# Patient Record
Sex: Male | Born: 1950
Health system: Southern US, Community
[De-identification: ages and names within clinical notes are randomized; demographics above are authoritative.]

## PROBLEM LIST (undated history)

## (undated) DIAGNOSIS — C801 Malignant (primary) neoplasm, unspecified: Secondary | ICD-10-CM

## (undated) DIAGNOSIS — M1990 Unspecified osteoarthritis, unspecified site: Secondary | ICD-10-CM

## (undated) DIAGNOSIS — E785 Hyperlipidemia, unspecified: Secondary | ICD-10-CM

## (undated) DIAGNOSIS — Z87442 Personal history of urinary calculi: Secondary | ICD-10-CM

## (undated) DIAGNOSIS — I10 Essential (primary) hypertension: Secondary | ICD-10-CM

## (undated) DIAGNOSIS — T7840XA Allergy, unspecified, initial encounter: Secondary | ICD-10-CM

## (undated) DIAGNOSIS — R51 Headache: Secondary | ICD-10-CM

## (undated) DIAGNOSIS — N200 Calculus of kidney: Secondary | ICD-10-CM

## (undated) HISTORY — DX: Hyperlipidemia, unspecified: E78.5

## (undated) HISTORY — DX: Malignant (primary) neoplasm, unspecified: C80.1

## (undated) HISTORY — DX: Essential (primary) hypertension: I10

## (undated) HISTORY — DX: Allergy, unspecified, initial encounter: T78.40XA

## (undated) HISTORY — PX: OTHER SURGICAL HISTORY: SHX169

## (undated) HISTORY — PX: KIDNEY STONE SURGERY: SHX686

## (undated) HISTORY — DX: Calculus of kidney: N20.0

## (undated) HISTORY — DX: Headache: R51

## (undated) HISTORY — PX: TONSILLECTOMY: SUR1361

## (undated) HISTORY — PX: COLONOSCOPY: SHX174

---

## 1998-09-08 HISTORY — PX: OTHER SURGICAL HISTORY: SHX169

## 1999-05-22 ENCOUNTER — Emergency Department (HOSPITAL_COMMUNITY): Admission: EM | Admit: 1999-05-22 | Discharge: 1999-05-22 | Payer: Self-pay | Admitting: Emergency Medicine

## 1999-05-22 ENCOUNTER — Encounter: Payer: Self-pay | Admitting: Emergency Medicine

## 2011-09-09 HISTORY — PX: POLYPECTOMY: SHX149

## 2016-07-23 DIAGNOSIS — L821 Other seborrheic keratosis: Secondary | ICD-10-CM | POA: Diagnosis not present

## 2016-07-23 DIAGNOSIS — Z85828 Personal history of other malignant neoplasm of skin: Secondary | ICD-10-CM | POA: Diagnosis not present

## 2016-07-23 DIAGNOSIS — L570 Actinic keratosis: Secondary | ICD-10-CM | POA: Diagnosis not present

## 2016-07-23 DIAGNOSIS — D1801 Hemangioma of skin and subcutaneous tissue: Secondary | ICD-10-CM | POA: Diagnosis not present

## 2016-07-23 DIAGNOSIS — D485 Neoplasm of uncertain behavior of skin: Secondary | ICD-10-CM | POA: Diagnosis not present

## 2016-07-23 DIAGNOSIS — C44309 Unspecified malignant neoplasm of skin of other parts of face: Secondary | ICD-10-CM | POA: Diagnosis not present

## 2016-09-10 DIAGNOSIS — C44329 Squamous cell carcinoma of skin of other parts of face: Secondary | ICD-10-CM | POA: Diagnosis not present

## 2016-09-10 DIAGNOSIS — L900 Lichen sclerosus et atrophicus: Secondary | ICD-10-CM | POA: Diagnosis not present

## 2017-06-04 DIAGNOSIS — D485 Neoplasm of uncertain behavior of skin: Secondary | ICD-10-CM | POA: Diagnosis not present

## 2017-06-04 DIAGNOSIS — L570 Actinic keratosis: Secondary | ICD-10-CM | POA: Diagnosis not present

## 2017-06-04 DIAGNOSIS — C4442 Squamous cell carcinoma of skin of scalp and neck: Secondary | ICD-10-CM | POA: Diagnosis not present

## 2017-07-15 DIAGNOSIS — C4442 Squamous cell carcinoma of skin of scalp and neck: Secondary | ICD-10-CM | POA: Diagnosis not present

## 2017-09-21 ENCOUNTER — Encounter: Payer: Self-pay | Admitting: Family Medicine

## 2017-09-21 ENCOUNTER — Ambulatory Visit (INDEPENDENT_AMBULATORY_CARE_PROVIDER_SITE_OTHER): Payer: PPO | Admitting: Family Medicine

## 2017-09-21 VITALS — BP 136/72 | HR 77 | Ht 71.5 in | Wt 184.0 lb

## 2017-09-21 DIAGNOSIS — Z23 Encounter for immunization: Secondary | ICD-10-CM | POA: Insufficient documentation

## 2017-09-21 DIAGNOSIS — Z0001 Encounter for general adult medical examination with abnormal findings: Secondary | ICD-10-CM | POA: Diagnosis not present

## 2017-09-21 DIAGNOSIS — G4489 Other headache syndrome: Secondary | ICD-10-CM

## 2017-09-21 DIAGNOSIS — G4459 Other complicated headache syndrome: Secondary | ICD-10-CM

## 2017-09-21 DIAGNOSIS — Z1211 Encounter for screening for malignant neoplasm of colon: Secondary | ICD-10-CM | POA: Insufficient documentation

## 2017-09-21 DIAGNOSIS — Z131 Encounter for screening for diabetes mellitus: Secondary | ICD-10-CM | POA: Insufficient documentation

## 2017-09-21 DIAGNOSIS — Z0000 Encounter for general adult medical examination without abnormal findings: Secondary | ICD-10-CM | POA: Insufficient documentation

## 2017-09-21 NOTE — Progress Notes (Addendum)
Subjective:  Patient ID: Kerry Bright, male    DOB: 1951/07/26  Age: 67 y.o. MRN: [id number]  CC: No chief complaint on file.   HPI Kerry Bright presents for establishment of care, physical exam, and to be evaluated for a headache that he has been experiencing over the last month or 2.  The headache is multifocal it can last for varying lengths of time.  It seems to respond to Tylenol or an Aleve but then returns.  He has no history of headaches prior to these.. Left hand dominant. The headache is primarily in his frontal area.  He feels no pressure stuffy nose drainage or rhinorrhea.  He has had no weight loss or night sweats.  He has had no changes in vision difficulty swallowing or unilateral weakness or paresthesias.  He feels healthy.  He drinks anywhere from 2-3 beers daily.  He denies binge drinking.  He does not use drugs.  He quit smoking 39 years ago.  Of note he was a bachelor over the past 49 years but recently married his significant other 4 months ago.  They just bought a house together.  He bought a car.  His daughter and father both died of brain tumors.  He is not active physically as he was before his marriage but he continues to walk for exercise.  Chart review shows that he had a colonoscopy in 2013 with one hyperplastic polyp.  History Kerry Bright has a past medical history of Frequent headaches and Kidney stones.   He has a past surgical history that includes Tonsillectomy.   His family history includes Cancer in his daughter and father.He reports that he has quit smoking. he has never used smokeless tobacco. His alcohol and drug histories are not on file.  No outpatient medications prior to visit.   No facility-administered medications prior to visit.     ROS Review of Systems  Constitutional: Negative for chills, fatigue, fever and unexpected weight change.  HENT: Negative for congestion, hearing loss, postnasal drip, rhinorrhea, sinus pressure, sore throat and trouble  swallowing.   Eyes: Negative for photophobia and visual disturbance.  Respiratory: Negative for cough and shortness of breath.   Cardiovascular: Negative for chest pain and palpitations.  Gastrointestinal: Negative.   Endocrine: Negative for polyphagia and polyuria.  Genitourinary: Negative for decreased urine volume, difficulty urinating and urgency.  Musculoskeletal: Negative for arthralgias and myalgias.  Skin: Negative for color change and rash.  Allergic/Immunologic: Negative for immunocompromised state.  Neurological: Positive for headaches. Negative for facial asymmetry, speech difficulty, weakness, light-headedness and numbness.  Hematological: Does not bruise/bleed easily.  Psychiatric/Behavioral: Negative for dysphoric mood, self-injury and suicidal ideas. The patient is not nervous/anxious.     Objective:  BP 136/72 (BP Location: Left Arm, Patient Position: Sitting, Cuff Size: Normal)   Pulse 77   Ht 5' 11.5" (1.816 m)   Wt 184 lb (83.5 kg)   SpO2 97%   BMI 25.31 kg/m   Physical Exam  Constitutional: He is oriented to person, place, and time. He appears well-developed and well-nourished. No distress.  HENT:  Head: Normocephalic and atraumatic.  Right Ear: External ear normal.  Left Ear: External ear normal.  Mouth/Throat: Oropharynx is clear and moist. No oropharyngeal exudate.  Eyes: Conjunctivae and EOM are normal. Pupils are equal, round, and reactive to light. Right eye exhibits no discharge. Left eye exhibits no discharge. No scleral icterus.  Fundoscopic exam:      The right eye shows no AV nicking, no  exudate, no hemorrhage and no papilledema.       The left eye shows no AV nicking, no exudate, no hemorrhage and no papilledema.  Neck: Neck supple. No JVD present. No tracheal deviation present. No thyromegaly present.  Cardiovascular: Normal rate, regular rhythm and normal heart sounds.  Pulmonary/Chest: Effort normal and breath sounds normal. No stridor. No  respiratory distress. He has no wheezes. He has no rales.  Abdominal: Bowel sounds are normal. He exhibits no distension. There is no tenderness. There is no rebound and no guarding.  Lymphadenopathy:    He has no cervical adenopathy.  Neurological: He is alert and oriented to person, place, and time. He has normal strength. No cranial nerve deficit.  Head is cocked to left.   Skin: Skin is warm and dry. He is not diaphoretic.  Psychiatric: He has a normal mood and affect. His behavior is normal.      Assessment & Plan:   Diagnoses and all orders for this visit:  Need for influenza vaccination -     Flu vaccine HIGH DOSE PF  Need for 23-polyvalent pneumococcal polysaccharide vaccine -     Pneumococcal polysaccharide vaccine 23-valent greater than or equal to 2yo subcutaneous/IM  Healthcare maintenance -     CBC; Future -     Comprehensive metabolic panel; Future -     Hepatitis C antibody; Future -     Lipid panel; Future -     PSA; Future -     TSH; Future -     Urinalysis, Routine w reflex microscopic; Future -     VITAMIN D 25 Hydroxy (Vit-D Deficiency, Fractures); Future  Other headache syndrome -     CBC; Future  Other complicated headache syndrome -     Cancel: CT Head W Contrast; Future -     Cancel: CT Head Wo Contrast; Future -     CT HEAD W & WO CONTRAST; Future   Kerry Bright does not currently have medications on file.  No orders of the defined types were placed in this encounter.  He is alert and maintain a healthy lifestyle and increase his exercise to 30 minutes 5 days/week.  Follow-up: after his scan  Libby Maw, MD

## 2017-09-22 ENCOUNTER — Other Ambulatory Visit (INDEPENDENT_AMBULATORY_CARE_PROVIDER_SITE_OTHER): Payer: PPO

## 2017-09-22 ENCOUNTER — Encounter: Payer: Self-pay | Admitting: Family Medicine

## 2017-09-22 DIAGNOSIS — Z23 Encounter for immunization: Secondary | ICD-10-CM

## 2017-09-22 DIAGNOSIS — Z0000 Encounter for general adult medical examination without abnormal findings: Secondary | ICD-10-CM | POA: Diagnosis not present

## 2017-09-22 DIAGNOSIS — G4489 Other headache syndrome: Secondary | ICD-10-CM

## 2017-09-22 LAB — CBC
HCT: 45.4 % (ref 39.0–52.0)
HEMOGLOBIN: 15.3 g/dL (ref 13.0–17.0)
MCHC: 33.6 g/dL (ref 30.0–36.0)
MCV: 89.3 fl (ref 78.0–100.0)
Platelets: 211 10*3/uL (ref 150.0–400.0)
RBC: 5.09 Mil/uL (ref 4.22–5.81)
RDW: 13.8 % (ref 11.5–15.5)
WBC: 7.5 10*3/uL (ref 4.0–10.5)

## 2017-09-22 LAB — URINALYSIS, ROUTINE W REFLEX MICROSCOPIC
BILIRUBIN URINE: NEGATIVE
Hgb urine dipstick: NEGATIVE
Ketones, ur: NEGATIVE
Leukocytes, UA: NEGATIVE
Nitrite: NEGATIVE
PH: 7.5 (ref 5.0–8.0)
RBC / HPF: NONE SEEN (ref 0–?)
SPECIFIC GRAVITY, URINE: 1.015 (ref 1.000–1.030)
TOTAL PROTEIN, URINE-UPE24: NEGATIVE
Urine Glucose: NEGATIVE
Urobilinogen, UA: 0.2 (ref 0.0–1.0)
WBC UA: NONE SEEN (ref 0–?)

## 2017-09-22 LAB — COMPREHENSIVE METABOLIC PANEL
ALBUMIN: 4.4 g/dL (ref 3.5–5.2)
ALK PHOS: 40 U/L (ref 39–117)
ALT: 21 U/L (ref 0–53)
AST: 20 U/L (ref 0–37)
BUN: 21 mg/dL (ref 6–23)
CALCIUM: 9.1 mg/dL (ref 8.4–10.5)
CO2: 28 mEq/L (ref 19–32)
Chloride: 103 mEq/L (ref 96–112)
Creatinine, Ser: 0.93 mg/dL (ref 0.40–1.50)
GFR: 86.1 mL/min (ref >60.00)
Glucose, Bld: 101 mg/dL — ABNORMAL HIGH (ref 70–99)
POTASSIUM: 4.5 meq/L (ref 3.5–5.1)
SODIUM: 139 meq/L (ref 135–145)
Total Bilirubin: 1.2 mg/dL (ref 0.2–1.2)
Total Protein: 6.5 g/dL (ref 6.0–8.3)

## 2017-09-22 LAB — LIPID PANEL
CHOLESTEROL: 202 mg/dL — AB (ref 0–200)
HDL: 76.7 mg/dL (ref >39.00)
LDL CALC: 114 mg/dL — AB (ref 0–99)
NonHDL: 125.22
TRIGLYCERIDES: 58 mg/dL (ref 0.0–149.0)
Total CHOL/HDL Ratio: 3
VLDL: 11.6 mg/dL (ref 0.0–40.0)

## 2017-09-22 LAB — VITAMIN D 25 HYDROXY (VIT D DEFICIENCY, FRACTURES): VITD: 31.98 ng/mL (ref 30.00–100.00)

## 2017-09-22 LAB — TSH: TSH: 2.1 u[IU]/mL (ref 0.35–4.50)

## 2017-09-22 LAB — PSA: PSA: 0.91 ng/mL (ref 0.10–4.00)

## 2017-09-23 LAB — HEPATITIS C ANTIBODY
Hepatitis C Ab: NONREACTIVE
SIGNAL TO CUT-OFF: 0.01 (ref <1.00)

## 2017-09-24 ENCOUNTER — Ambulatory Visit (INDEPENDENT_AMBULATORY_CARE_PROVIDER_SITE_OTHER)
Admission: RE | Admit: 2017-09-24 | Discharge: 2017-09-24 | Disposition: A | Discharge status: Home / Self Care | Payer: PPO | Source: Ambulatory Visit | Attending: Family Medicine | Admitting: Family Medicine

## 2017-09-24 ENCOUNTER — Other Ambulatory Visit: Payer: Self-pay

## 2017-09-24 DIAGNOSIS — R7989 Other specified abnormal findings of blood chemistry: Secondary | ICD-10-CM

## 2017-09-24 DIAGNOSIS — R51 Headache: Secondary | ICD-10-CM | POA: Diagnosis not present

## 2017-09-24 DIAGNOSIS — G4459 Other complicated headache syndrome: Secondary | ICD-10-CM | POA: Diagnosis not present

## 2017-09-24 MED ORDER — IOPAMIDOL (ISOVUE-300) INJECTION 61%
80.0000 mL | Freq: Once | INTRAVENOUS | Status: AC | PRN
  Administered 2017-09-24: 80 mL via INTRAVENOUS

## 2017-09-24 NOTE — Addendum Note (Signed)
Addended by: Jon Billings on: 09/24/2017 09:27 AM   Modules accepted: Level of Service

## 2018-10-05 ENCOUNTER — Encounter: Payer: Self-pay | Admitting: Family Medicine

## 2018-10-05 ENCOUNTER — Ambulatory Visit (INDEPENDENT_AMBULATORY_CARE_PROVIDER_SITE_OTHER): Payer: PPO | Admitting: Family Medicine

## 2018-10-05 VITALS — BP 140/70 | HR 66 | Temp 97.8°F | Ht 71.5 in | Wt 176.6 lb

## 2018-10-05 DIAGNOSIS — Z23 Encounter for immunization: Secondary | ICD-10-CM | POA: Diagnosis not present

## 2018-10-05 DIAGNOSIS — M25552 Pain in left hip: Secondary | ICD-10-CM

## 2018-10-05 DIAGNOSIS — M7062 Trochanteric bursitis, left hip: Secondary | ICD-10-CM | POA: Insufficient documentation

## 2018-10-05 DIAGNOSIS — M25551 Pain in right hip: Secondary | ICD-10-CM | POA: Insufficient documentation

## 2018-10-05 MED ORDER — PREDNISONE 5 MG PO TABS: ORAL_TABLET | ORAL | 0 refills | Status: DC

## 2018-10-05 NOTE — Assessment & Plan Note (Signed)
Pain is possibly related to greater trochanteric bursitis versus sciatica.  Does not appear to be associated with the joint itself. -Try prednisone. -Counseled on home exercise therapy and supportive care. -If no improvement will consider injection and imaging.  Could consider physical therapy

## 2018-10-05 NOTE — Progress Notes (Signed)
Kerry Bright - 68 y.o. male MRN [id number]  Date of birth: 1951-01-25  SUBJECTIVE:  Including CC & ROS.  Chief Complaint  Patient presents with  . Pain    left hip and leg pain to knee/ limp when walking, no injury/ going on for one month    Kerry Bright is a 68 y.o. male that is presenting with lateral hip pain.  Also has some pain radiating down the lateral aspect of the leg.  Has been ongoing for about a month.  Denies any inciting event.  He is active with work and does several different lifting and maneuvering objects.  Has not had any history of similar pain.  It is intermittent in nature and worse with walking.  Pain is moderate.  It is sharp and stabbing.  Denies any numbness or tingling.    Review of Systems  Constitutional: Negative for fever.  HENT: Negative for congestion.   Respiratory: Negative for cough.   Cardiovascular: Negative for chest pain.  Gastrointestinal: Negative for abdominal pain.  Musculoskeletal: Positive for gait problem.  Skin: Negative for color change.  Neurological: Negative for weakness.  Hematological: Negative for adenopathy.  Psychiatric/Behavioral: Negative for agitation.    HISTORY: Past Medical, Surgical, Social, and Family History Reviewed & Updated per EMR.   Pertinent Historical Findings include:  Past Medical History:  Diagnosis Date  . Frequent headaches   . Kidney stones     Past Surgical History:  Procedure Laterality Date  . TONSILLECTOMY      No Known Allergies  Family History  Problem Relation Age of Onset  . Cancer Father   . Cancer Daughter      Social History   Socioeconomic History  . Marital status: Single    Spouse name: Not on file  . Number of children: Not on file  . Years of education: Not on file  . Highest education level: Not on file  Occupational History  . Not on file  Social Needs  . Financial resource strain: Not on file  . Food insecurity:    Worry: Not on file    Inability: Not on  file  . Transportation needs:    Medical: Not on file    Non-medical: Not on file  Tobacco Use  . Smoking status: Former Research scientist (life sciences)  . Smokeless tobacco: Never Used  Substance and Sexual Activity  . Alcohol use: Not on file  . Drug use: Not on file  . Sexual activity: Not on file  Lifestyle  . Physical activity:    Days per week: Not on file    Minutes per session: Not on file  . Stress: Not on file  Relationships  . Social connections:    Talks on phone: Not on file    Gets together: Not on file    Attends religious service: Not on file    Active member of club or organization: Not on file    Attends meetings of clubs or organizations: Not on file    Relationship status: Not on file  . Intimate partner violence:    Fear of current or ex partner: Not on file    Emotionally abused: Not on file    Physically abused: Not on file    Forced sexual activity: Not on file  Other Topics Concern  . Not on file  Social History Narrative  . Not on file     PHYSICAL EXAM:  VS: BP 140/70   Pulse 66   Temp 97.8 F (  36.6 C) (Oral)   Ht 5' 11.5" (1.816 m)   Wt 176 lb 9.6 oz (80.1 kg)   SpO2 97%   BMI 24.29 kg/m  Physical Exam Gen: NAD, alert, cooperative with exam, well-appearing ENT: normal lips, normal nasal mucosa,  Eye: normal EOM, normal conjunctiva and lids CV:  no edema, +2 pedal pulses   Resp: no accessory muscle use, non-labored,  Skin: no rashes, no areas of induration  Neuro: normal tone, normal sensation to touch Psych:  normal insight, alert and oriented MSK:  Back/left hip: Tenderness to palpation over the lateral trochanter. Normal internal and external rotation. Normal strength resistance with hip flexion, knee flexion and extension, dorsiflexion and plantarflexion. Negative straight leg raise bilaterally. No tenderness to palpation of the paraspinal muscles. Neurovascularly intact     ASSESSMENT & PLAN:   Left hip pain Pain is possibly related to  greater trochanteric bursitis versus sciatica.  Does not appear to be associated with the joint itself. -Try prednisone. -Counseled on home exercise therapy and supportive care. -If no improvement will consider injection and imaging.  Could consider physical therapy

## 2018-10-05 NOTE — Patient Instructions (Signed)
Nice to meet you  Please try the medicine  Please try the exercises  Please try heat or ice on the area  Please see me back in 3-4 weeks if no better.

## 2018-10-25 ENCOUNTER — Ambulatory Visit (INDEPENDENT_AMBULATORY_CARE_PROVIDER_SITE_OTHER): Payer: PPO

## 2018-10-25 ENCOUNTER — Encounter: Payer: Self-pay | Admitting: Family Medicine

## 2018-10-25 ENCOUNTER — Ambulatory Visit (INDEPENDENT_AMBULATORY_CARE_PROVIDER_SITE_OTHER): Payer: PPO | Admitting: Family Medicine

## 2018-10-25 VITALS — BP 130/82 | HR 66 | Temp 97.9°F | Ht 71.5 in | Wt 181.0 lb

## 2018-10-25 DIAGNOSIS — M25552 Pain in left hip: Secondary | ICD-10-CM | POA: Diagnosis not present

## 2018-10-25 DIAGNOSIS — M7062 Trochanteric bursitis, left hip: Secondary | ICD-10-CM

## 2018-10-25 DIAGNOSIS — M160 Bilateral primary osteoarthritis of hip: Secondary | ICD-10-CM | POA: Diagnosis not present

## 2018-10-25 NOTE — Assessment & Plan Note (Signed)
Had mild improvement with the prednisone.  Pain seems to be localized laterally.  Could be sciatic in nature. -Greater trochanteric injection today. -Counseled on home exercise therapy and supportive care. -X-ray. -If no improvement consider imaging the back or starting gabapentin.  Could consider physical therapy.

## 2018-10-25 NOTE — Progress Notes (Signed)
Kerry Bright - 68 y.o. male MRN [id number]  Date of birth: 12-May-1951  SUBJECTIVE:  Including CC & ROS.  Chief Complaint  Patient presents with  . Pain    pain left hip radiating down left leg/pt states prednisone worked for one day/ still same pain / wants injection    Kerry Bright is a 68 y.o. male that is presenting with acute worsening left lateral hip pain.  He has tried prednisone with little to no improvement.  The pain seems to be localized to the lateral greater trochanter but he does have some extension distally to the knee.  He denies any specific inciting event or cause.  It is a throbbing sensation.  It is intermittent.  Can be moderate to severe.  Denies any weakness or numbness.  His symptoms been ongoing for over a month..   Review of Systems  Constitutional: Negative for fever.  HENT: Negative for congestion.   Respiratory: Negative for cough.   Cardiovascular: Negative for chest pain.  Gastrointestinal: Negative for abdominal pain.  Musculoskeletal: Positive for gait problem.  Skin: Negative for color change.  Neurological: Negative for weakness.  Hematological: Negative for adenopathy.  Psychiatric/Behavioral: Negative for agitation.    HISTORY: Past Medical, Surgical, Social, and Family History Reviewed & Updated per EMR.   Pertinent Historical Findings include:  Past Medical History:  Diagnosis Date  . Frequent headaches   . Kidney stones     Past Surgical History:  Procedure Laterality Date  . TONSILLECTOMY      No Known Allergies  Family History  Problem Relation Age of Onset  . Cancer Father   . Cancer Daughter      Social History   Socioeconomic History  . Marital status: Single    Spouse name: Not on file  . Number of children: Not on file  . Years of education: Not on file  . Highest education level: Not on file  Occupational History  . Not on file  Social Needs  . Financial resource strain: Not on file  . Food insecurity:   Worry: Not on file    Inability: Not on file  . Transportation needs:    Medical: Not on file    Non-medical: Not on file  Tobacco Use  . Smoking status: Former Research scientist (life sciences)  . Smokeless tobacco: Never Used  Substance and Sexual Activity  . Alcohol use: Not on file  . Drug use: Not on file  . Sexual activity: Not on file  Lifestyle  . Physical activity:    Days per week: Not on file    Minutes per session: Not on file  . Stress: Not on file  Relationships  . Social connections:    Talks on phone: Not on file    Gets together: Not on file    Attends religious service: Not on file    Active member of club or organization: Not on file    Attends meetings of clubs or organizations: Not on file    Relationship status: Not on file  . Intimate partner violence:    Fear of current or ex partner: Not on file    Emotionally abused: Not on file    Physically abused: Not on file    Forced sexual activity: Not on file  Other Topics Concern  . Not on file  Social History Narrative  . Not on file     PHYSICAL EXAM:  VS: BP 130/82   Pulse 66   Temp 97.9 F (36.6  C) (Oral)   Ht 5' 11.5" (1.816 m)   Wt 181 lb (82.1 kg)   SpO2 97%   BMI 24.89 kg/m  Physical Exam Gen: NAD, alert, cooperative with exam, well-appearing ENT: normal lips, normal nasal mucosa,  Eye: normal EOM, normal conjunctiva and lids CV:  no edema, +2 pedal pulses   Resp: no accessory muscle use, non-labored,  Skin: no rashes, no areas of induration  Neuro: normal tone, normal sensation to touch Psych:  normal insight, alert and oriented MSK:  Left hip: Tenderness palpation of the greater trochanter. Normal internal and external rotation. Normal strength resistance with hip flexion, knee flexion and extension, plantarflexion and dorsiflexion. Negative straight leg raise. Neurovascular intact   Aspiration/Injection Procedure Note Kerry Bright 01/06/1951  Procedure: Injection Indications: Left hip  pain  Procedure Details Consent: Risks of procedure as well as the alternatives and risks of each were explained to the (patient/caregiver).  Consent for procedure obtained. Time Out: Verified patient identification, verified procedure, site/side was marked, verified correct patient position, special equipment/implants available, medications/allergies/relevent history reviewed, required imaging and test results available.  Performed.  The area was cleaned with iodine and alcohol swabs.    The left greater trochanteric bursa was injected using 1 cc's of 40 mg Depo-Medrol and 4 cc's of 0.25 % bupivacaine with a 22 1 1/2" needle.  Ultrasound was used. Images were obtained in trans-views showing the injection.     A sterile dressing was applied.  Patient did tolerate procedure well.        ASSESSMENT & PLAN:   Greater trochanteric bursitis of left hip Had mild improvement with the prednisone.  Pain seems to be localized laterally.  Could be sciatic in nature. -Greater trochanteric injection today. -Counseled on home exercise therapy and supportive care. -X-ray. -If no improvement consider imaging the back or starting gabapentin.  Could consider physical therapy.

## 2018-10-25 NOTE — Patient Instructions (Signed)
Good to see you  Please try ice on the area  Please try the exercises  I will call you with the results from today  Please see me back in 3-4 weeks if no better.

## 2018-10-26 ENCOUNTER — Ambulatory Visit: Payer: Self-pay | Admitting: *Deleted

## 2018-10-26 ENCOUNTER — Telehealth: Payer: Self-pay | Admitting: Family Medicine

## 2018-10-26 NOTE — Telephone Encounter (Signed)
Pt called in for x-ray results.   I read the message from Dr. Clearance Coots from 10/26/2018 at 10:42 am.  The pt had further questions regarding the hip joint and his back.   He is requesting more information.  I obtained his phone number and have sent this message to Dr. Doristine Locks nurse pool.  [name] can be reached at [phone]

## 2018-10-26 NOTE — Telephone Encounter (Signed)
Left VM for patient. If he calls back please have him speak with a nurse/CMA and inform that his hip joint does show degenerative changes on the xray. If he didn't get improvement with the injection we may need to consider the hip joint as the source as well as considering the back. The [facility] can report results to patient.   If any questions then please take the best time and phone number to call and I will try to call him back.   Rosemarie Ax, MD [facility] 10/26/2018, 10:41 AM

## 2018-10-26 NOTE — Telephone Encounter (Signed)
Please advise Dr. Raeford Razor

## 2018-10-27 NOTE — Telephone Encounter (Signed)
Patient returned call and he states he will have his phone on for the rest of the day and he will be available for call back. Sorry he missed call earlier. [phone]

## 2018-10-27 NOTE — Telephone Encounter (Signed)
Spoke with pt he states he didn't have any further questions  just wanted to let Dr. Raeford Razor know that the shot didn't really help but isn't experiencing any worse pain.

## 2018-10-27 NOTE — Telephone Encounter (Signed)
Left VM for patient. If he calls back please have him speak with a nurse/CMA and ask what questions he has. The [facility] can report results to patient.   If any questions then please take the best time and phone number to call and I will try to call him back.   Rosemarie Ax, MD [facility] 10/27/2018, 10:20 AM

## 2018-11-19 DIAGNOSIS — L814 Other melanin hyperpigmentation: Secondary | ICD-10-CM | POA: Diagnosis not present

## 2018-11-19 DIAGNOSIS — D225 Melanocytic nevi of trunk: Secondary | ICD-10-CM | POA: Diagnosis not present

## 2018-11-19 DIAGNOSIS — L821 Other seborrheic keratosis: Secondary | ICD-10-CM | POA: Diagnosis not present

## 2018-11-19 DIAGNOSIS — L570 Actinic keratosis: Secondary | ICD-10-CM | POA: Diagnosis not present

## 2018-11-19 DIAGNOSIS — Z85828 Personal history of other malignant neoplasm of skin: Secondary | ICD-10-CM | POA: Diagnosis not present

## 2018-11-19 DIAGNOSIS — D1801 Hemangioma of skin and subcutaneous tissue: Secondary | ICD-10-CM | POA: Diagnosis not present

## 2018-12-27 ENCOUNTER — Telehealth: Payer: Self-pay | Admitting: Family Medicine

## 2018-12-27 NOTE — Telephone Encounter (Signed)
Called on behalf of Dr Ethelene Hal because it has been a while since last appt, left message

## 2019-01-17 ENCOUNTER — Telehealth: Payer: Self-pay

## 2019-01-17 NOTE — Telephone Encounter (Signed)
Called pt and discussed how to get a CD copy of his imaging. Instructed pt with a number to call to contact Medical records at [facility].   Copied from [place]. Topic: General - Other >> Jan 17, 2019 12:25 PM Lennox Solders wrote: Reason for CRM: Pt is calling and needs hip xray on disc. Pt has an appt with dr Veverly [facility] on  this Wednesday. Pt would like to pick up disc >> Jan 17, 2019 12:56 PM Rodrigo Ran, CMA wrote: Pt saw Dr. Raeford Razor for his hip pain.

## 2019-01-18 ENCOUNTER — Encounter: Payer: Self-pay | Admitting: Family Medicine

## 2019-01-18 ENCOUNTER — Ambulatory Visit (INDEPENDENT_AMBULATORY_CARE_PROVIDER_SITE_OTHER): Payer: PPO | Admitting: Family Medicine

## 2019-01-18 VITALS — Ht 71.5 in

## 2019-01-18 DIAGNOSIS — N5201 Erectile dysfunction due to arterial insufficiency: Secondary | ICD-10-CM | POA: Insufficient documentation

## 2019-01-18 MED ORDER — SILDENAFIL CITRATE 20 MG PO TABS: ORAL_TABLET | ORAL | 0 refills | Status: DC

## 2019-01-18 NOTE — Progress Notes (Signed)
Established Patient Office Visit  Subjective:  Patient ID: Kerry Bright, male    DOB: 15-Jan-1951  Age: 68 y.o. MRN: [id number]  CC:  Chief Complaint  Patient presents with  . Erectile Dysfunction    HPI Capone Schwinn presents for evaluation treatment for erectile dysfunction.  Says that over the last several weeks he has experienced decreased firmness with his erection and some difficulty with orgasm.  He is in a stable relationship and that has been with his current partner for 3 years.  Denies any decrease in his libido.  Continues to work part-time.  Currently is not exercising much.  He drinks 2-3 beers daily.  He does not smoke or use illicit drugs.   Past Medical History:  Diagnosis Date  . Frequent headaches   . Kidney stones     Past Surgical History:  Procedure Laterality Date  . TONSILLECTOMY      Family History  Problem Relation Age of Onset  . Cancer Father   . Cancer Daughter     Social History   Socioeconomic History  . Marital status: Single    Spouse name: Not on file  . Number of children: Not on file  . Years of education: Not on file  . Highest education level: Not on file  Occupational History  . Not on file  Social Needs  . Financial resource strain: Not on file  . Food insecurity:    Worry: Not on file    Inability: Not on file  . Transportation needs:    Medical: Not on file    Non-medical: Not on file  Tobacco Use  . Smoking status: Former Research scientist (life sciences)  . Smokeless tobacco: Never Used  Substance and Sexual Activity  . Alcohol use: Not on file  . Drug use: Not on file  . Sexual activity: Not on file  Lifestyle  . Physical activity:    Days per week: Not on file    Minutes per session: Not on file  . Stress: Not on file  Relationships  . Social connections:    Talks on phone: Not on file    Gets together: Not on file    Attends religious service: Not on file    Active member of club or organization: Not on file    Attends meetings  of clubs or organizations: Not on file    Relationship status: Not on file  . Intimate partner violence:    Fear of current or ex partner: Not on file    Emotionally abused: Not on file    Physically abused: Not on file    Forced sexual activity: Not on file  Other Topics Concern  . Not on file  Social History Narrative  . Not on file    Outpatient Medications Prior to Visit  Medication Sig Dispense Refill  . predniSONE (DELTASONE) 5 MG tablet Take 6 pills for first day, 5 pills second day, 4 pills third day, 3 pills fourth day, 2 pills the fifth day, and 1 pill sixth day. (Patient not taking: Reported on 10/25/2018) 21 tablet 0   No facility-administered medications prior to visit.     No Known Allergies  ROS Review of Systems  Constitutional: Negative.   Respiratory: Negative.   Cardiovascular: Negative.   Gastrointestinal: Negative.   Genitourinary: Negative for difficulty urinating, frequency and urgency.  Neurological: Negative.   Hematological: Does not bruise/bleed easily.  Psychiatric/Behavioral: Negative.       Objective:    Physical Exam  Constitutional: He is oriented to person, place, and time. He appears well-developed and well-nourished. No distress.  HENT:  Head: Normocephalic and atraumatic.  Right Ear: External ear normal.  Left Ear: External ear normal.  Eyes: Right eye exhibits no discharge. Left eye exhibits no discharge. No scleral icterus.  Pulmonary/Chest: Effort normal.  Neurological: He is alert and oriented to person, place, and time.  Skin: Skin is warm and dry. He is not diaphoretic.  Psychiatric: He has a normal mood and affect. His behavior is normal.    Ht 5' 11.5" (1.816 m)   BMI 24.89 kg/m  Wt Readings from Last 3 Encounters:  10/25/18 181 lb (82.1 kg)  10/05/18 176 lb 9.6 oz (80.1 kg)  09/21/17 184 lb (83.5 kg)     Health Maintenance Due  Topic Date Due  . TETANUS/TDAP  08/05/1969  . COLONOSCOPY  08/05/2000  . PNA vac  Low Risk Adult (2 of 2 - PCV13) 09/21/2018    There are no preventive care reminders to display for this patient.  Lab Results  Component Value Date   TSH 2.10 09/22/2017   Lab Results  Component Value Date   WBC 7.5 09/22/2017   HGB 15.3 09/22/2017   HCT 45.4 09/22/2017   MCV 89.3 09/22/2017   PLT 211.0 09/22/2017   Lab Results  Component Value Date   NA 139 09/22/2017   K 4.5 09/22/2017   CO2 28 09/22/2017   GLUCOSE 101 (H) 09/22/2017   BUN 21 09/22/2017   CREATININE 0.93 09/22/2017   BILITOT 1.2 09/22/2017   ALKPHOS 40 09/22/2017   AST 20 09/22/2017   ALT 21 09/22/2017   PROT 6.5 09/22/2017   ALBUMIN 4.4 09/22/2017   CALCIUM 9.1 09/22/2017   GFR 86.10 09/22/2017   Lab Results  Component Value Date   CHOL 202 (H) 09/22/2017   Lab Results  Component Value Date   HDL 76.70 09/22/2017   Lab Results  Component Value Date   LDLCALC 114 (H) 09/22/2017   Lab Results  Component Value Date   TRIG 58.0 09/22/2017   Lab Results  Component Value Date   CHOLHDL 3 09/22/2017   No results found for: HGBA1C    Assessment & Plan:   Problem List Items Addressed This Visit      Cardiovascular and Mediastinum   Erectile dysfunction due to arterial insufficiency - Primary   Relevant Medications   sildenafil (REVATIO) 20 MG tablet      Meds ordered this encounter  Medications  . sildenafil (REVATIO) 20 MG tablet    Sig: Take 3-5 pills 45 minutes prior daily as needed    Dispense:  50 tablet    Refill:  0    Follow-up: Return if symptoms worsen or fail to improve.    Libby Maw, MDVirtual Visit via Video Note  I connected with [name] on 01/18/19 at  2:30 PM EDT by a video enabled telemedicine application and verified that I am speaking with the correct person using two identifiers.  Location: Patient: work Provider:    I discussed the limitations of evaluation and management by telemedicine and the availability of in person  appointments. The patient expressed understanding and agreed to proceed.  History of Present Illness:    Observations/Objective:   Assessment and Plan:   Follow Up Instructions:    I discussed the assessment and treatment plan with the patient. The patient was provided an opportunity to ask questions and all were answered. The patient  agreed with the plan and demonstrated an understanding of the instructions.   The patient was advised to call back or seek an in-person evaluation if the symptoms worsen or if the condition fails to improve as anticipated.  I provided 20 minutes of non-face-to-face time during this encounter.

## 2019-01-19 DIAGNOSIS — M25552 Pain in left hip: Secondary | ICD-10-CM | POA: Diagnosis not present

## 2019-02-01 DIAGNOSIS — M25552 Pain in left hip: Secondary | ICD-10-CM | POA: Diagnosis not present

## 2019-02-28 ENCOUNTER — Telehealth: Payer: Self-pay | Admitting: Family Medicine

## 2019-02-28 DIAGNOSIS — M1612 Unilateral primary osteoarthritis, left hip: Secondary | ICD-10-CM | POA: Diagnosis not present

## 2019-02-28 DIAGNOSIS — M25552 Pain in left hip: Secondary | ICD-10-CM | POA: Diagnosis not present

## 2019-02-28 NOTE — Telephone Encounter (Signed)
Patient came into office and left surgical clearance form that needs to be complete. Patient requested that form be faxed once complete and to call him at [phone] once it has been faxed. Forms are in Dr. Bebe Shaggy file in the front office.

## 2019-02-28 NOTE — Telephone Encounter (Signed)
Can we get him scheduled for a physical? We have to do blood work/EKG for his clearance form. Thank you!

## 2019-03-01 NOTE — Telephone Encounter (Signed)
Tried to call to set up, had to leave message

## 2019-03-01 NOTE — Telephone Encounter (Signed)
Pt is coming in on 03/04/2019

## 2019-03-03 ENCOUNTER — Telehealth: Payer: Self-pay

## 2019-03-03 NOTE — Telephone Encounter (Signed)

## 2019-03-04 ENCOUNTER — Ambulatory Visit (INDEPENDENT_AMBULATORY_CARE_PROVIDER_SITE_OTHER): Payer: PPO | Admitting: Family Medicine

## 2019-03-04 ENCOUNTER — Encounter: Payer: Self-pay | Admitting: Family Medicine

## 2019-03-04 VITALS — BP 120/78 | HR 67 | Temp 97.4°F | Resp 18 | Ht 70.75 in | Wt 179.4 lb

## 2019-03-04 DIAGNOSIS — Z0000 Encounter for general adult medical examination without abnormal findings: Secondary | ICD-10-CM

## 2019-03-04 LAB — COMPREHENSIVE METABOLIC PANEL
ALT: 15 U/L (ref 0–53)
AST: 18 U/L (ref 0–37)
Albumin: 4.3 g/dL (ref 3.5–5.2)
Alkaline Phosphatase: 48 U/L (ref 39–117)
BUN: 16 mg/dL (ref 6–23)
CO2: 25 mEq/L (ref 19–32)
Calcium: 9 mg/dL (ref 8.4–10.5)
Chloride: 106 mEq/L (ref 96–112)
Creatinine, Ser: 0.7 mg/dL (ref 0.40–1.50)
GFR: 111.96 mL/min (ref >60.00)
Glucose, Bld: 99 mg/dL (ref 70–99)
Potassium: 4.2 mEq/L (ref 3.5–5.1)
Sodium: 140 mEq/L (ref 135–145)
Total Bilirubin: 0.8 mg/dL (ref 0.2–1.2)
Total Protein: 6.3 g/dL (ref 6.0–8.3)

## 2019-03-04 LAB — CBC
HCT: 42.4 % (ref 39.0–52.0)
Hemoglobin: 14.1 g/dL (ref 13.0–17.0)
MCHC: 33.2 g/dL (ref 30.0–36.0)
MCV: 87.4 fl (ref 78.0–100.0)
Platelets: 225 10*3/uL (ref 150.0–400.0)
RBC: 4.85 Mil/uL (ref 4.22–5.81)
RDW: 14.4 % (ref 11.5–15.5)
WBC: 4.9 10*3/uL (ref 4.0–10.5)

## 2019-03-04 LAB — URINALYSIS, ROUTINE W REFLEX MICROSCOPIC
Bilirubin Urine: NEGATIVE
Ketones, ur: NEGATIVE
Leukocytes,Ua: NEGATIVE
Nitrite: NEGATIVE
Specific Gravity, Urine: 1.02 (ref 1.000–1.030)
Total Protein, Urine: NEGATIVE
Urine Glucose: NEGATIVE
Urobilinogen, UA: 0.2 (ref 0.0–1.0)
WBC, UA: NONE SEEN (ref 0–?)
pH: 6 (ref 5.0–8.0)

## 2019-03-04 LAB — LIPID PANEL
Cholesterol: 202 mg/dL — ABNORMAL HIGH (ref 0–200)
HDL: 81 mg/dL (ref >39.00)
LDL Cholesterol: 113 mg/dL — ABNORMAL HIGH (ref 0–99)
NonHDL: 121.01
Total CHOL/HDL Ratio: 2
Triglycerides: 38 mg/dL (ref 0.0–149.0)
VLDL: 7.6 mg/dL (ref 0.0–40.0)

## 2019-03-04 LAB — PSA: PSA: 1.45 ng/mL (ref 0.10–4.00)

## 2019-03-04 NOTE — Patient Instructions (Signed)
Health Maintenance After Age 69 After age 69, you are at a higher risk for certain long-term diseases and infections as well as injuries from falls. Falls are a major cause of broken bones and head injuries in people who are older than age 69. Getting regular preventive care can help to keep you healthy and well. Preventive care includes getting regular testing and making lifestyle changes as recommended by your health care provider. Talk with your health care provider about:  Which screenings and tests you should have. A screening is a test that checks for a disease when you have no symptoms.  A diet and exercise plan that is right for you. What should I know about screenings and tests to prevent falls? Screening and testing are the best ways to find a health problem early. Early diagnosis and treatment give you the best chance of managing medical conditions that are common after age 69. Certain conditions and lifestyle choices may make you more likely to have a fall. Your health care provider may recommend:  Regular vision checks. Poor vision and conditions such as cataracts can make you more likely to have a fall. If you wear glasses, make sure to get your prescription updated if your vision changes.  Medicine review. Work with your health care provider to regularly review all of the medicines you are taking, including over-the-counter medicines. Ask your health care provider about any side effects that may make you more likely to have a fall. Tell your health care provider if any medicines that you take make you feel dizzy or sleepy.  Osteoporosis screening. Osteoporosis is a condition that causes the bones to get weaker. This can make the bones weak and cause them to break more easily.  Blood pressure screening. Blood pressure changes and medicines to control blood pressure can make you feel dizzy.  Strength and balance checks. Your health care provider may recommend certain tests to check your  strength and balance while standing, walking, or changing positions.  Foot health exam. Foot pain and numbness, as well as not wearing proper footwear, can make you more likely to have a fall.  Depression screening. You may be more likely to have a fall if you have a fear of falling, feel emotionally low, or feel unable to do activities that you used to do.  Alcohol use screening. Using too much alcohol can affect your balance and may make you more likely to have a fall. What actions can I take to lower my risk of falls? General instructions  Talk with your health care provider about your risks for falling. Tell your health care provider if: ? You fall. Be sure to tell your health care provider about all falls, even ones that seem minor. ? You feel dizzy, sleepy, or off-balance.  Take over-the-counter and prescription medicines only as told by your health care provider. These include any supplements.  Eat a healthy diet and maintain a healthy weight. A healthy diet includes low-fat dairy products, low-fat (lean) meats, and fiber from whole grains, beans, and lots of fruits and vegetables. Home safety  Remove any tripping hazards, such as rugs, cords, and clutter.  Install safety equipment such as grab bars in bathrooms and safety rails on stairs.  Keep rooms and walkways well-lit. Activity   Follow a regular exercise program to stay fit. This will help you maintain your balance. Ask your health care provider what types of exercise are appropriate for you.  If you need a cane or   walker, use it as recommended by your health care provider.  Wear supportive shoes that have nonskid soles. Lifestyle  Do not drink alcohol if your health care provider tells you not to drink.  If you drink alcohol, limit how much you have: ? 0-1 drink a day for women. ? 0-2 drinks a day for men.  Be aware of how much alcohol is in your drink. In the [place] one drink equals one typical bottle of beer (12  oz), one-half glass of wine (5 oz), or one shot of hard liquor (1 oz).  Do not use any products that contain nicotine or tobacco, such as cigarettes and e-cigarettes. If you need help quitting, ask your health care provider. Summary  Having a healthy lifestyle and getting preventive care can help to protect your health and wellness after age 69.  Screening and testing are the best way to find a health problem early and help you avoid having a fall. Early diagnosis and treatment give you the best chance for managing medical conditions that are more common for people who are older than age 69.  Falls are a major cause of broken bones and head injuries in people who are older than age 69. Take precautions to prevent a fall at home.  Work with your health care provider to learn what changes you can make to improve your health and wellness and to prevent falls. This information is not intended to replace advice given to you by your health care provider. Make sure you discuss any questions you have with your health care provider. Document Released: 07/08/2017 Document Revised: 07/08/2017 Document Reviewed: 07/08/2017 Elsevier Interactive Patient Education  2019 [employer] Inc[employer]  

## 2019-03-04 NOTE — Progress Notes (Signed)
Established Patient Office Visit  Subjective:  Patient ID: Kerry Bright, male    DOB: 29-Mar-1951  Age: 68 y.o. MRN: [id number]  CC:  Chief Complaint  Patient presents with  . Annual Exam    Surgical clearnece for L Hip replacement . EKG Reva Bores ?     HPI Kerry Bright presents for a complete physical for clearance for left hip replacement surgery.  Denies excessive pounding type exercise over the years and says that he is never weighed over 200 pounds.  Has not been able to exercise much due to hip pain and work requirements.  Quit smoking many years ago.  He drinks alcohol occasionally he does not use illicit drugs.  Last colonoscopy was at age 78 and he was told to return at age 84.  Urine flow is good.  Past Medical History:  Diagnosis Date  . Frequent headaches   . Kidney stones     Past Surgical History:  Procedure Laterality Date  . TONSILLECTOMY      Family History  Problem Relation Age of Onset  . Cancer Father   . Cancer Daughter     Social History   Socioeconomic History  . Marital status: Single    Spouse name: Not on file  . Number of children: Not on file  . Years of education: Not on file  . Highest education level: Not on file  Occupational History  . Not on file  Social Needs  . Financial resource strain: Not on file  . Food insecurity    Worry: Not on file    Inability: Not on file  . Transportation needs    Medical: Not on file    Non-medical: Not on file  Tobacco Use  . Smoking status: Former Research scientist (life sciences)  . Smokeless tobacco: Never Used  Substance and Sexual Activity  . Alcohol use: Not on file  . Drug use: Not on file  . Sexual activity: Not on file  Lifestyle  . Physical activity    Days per week: Not on file    Minutes per session: Not on file  . Stress: Not on file  Relationships  . Social Herbalist on phone: Not on file    Gets together: Not on file    Attends religious service: Not on file    Active member of club or  organization: Not on file    Attends meetings of clubs or organizations: Not on file    Relationship status: Not on file  . Intimate partner violence    Fear of current or ex partner: Not on file    Emotionally abused: Not on file    Physically abused: Not on file    Forced sexual activity: Not on file  Other Topics Concern  . Not on file  Social History Narrative  . Not on file    Outpatient Medications Prior to Visit  Medication Sig Dispense Refill  . sildenafil (REVATIO) 20 MG tablet Take 3-5 pills 45 minutes prior daily as needed 50 tablet 0   No facility-administered medications prior to visit.     No Known Allergies  ROS Review of Systems  Constitutional: Negative.   HENT: Negative.   Respiratory: Negative.   Cardiovascular: Negative.   Gastrointestinal: Negative.   Endocrine: Negative for polyphagia and polyuria.  Genitourinary: Negative.   Musculoskeletal: Positive for arthralgias and gait problem.  Allergic/Immunologic: Negative for immunocompromised state.  Hematological: Does not bruise/bleed easily.  Psychiatric/Behavioral: Negative.  Objective:    Physical Exam  Constitutional: He is oriented to person, place, and time. He appears well-developed and well-nourished. No distress.  HENT:  Head: Normocephalic and atraumatic.  Right Ear: External ear normal.  Left Ear: External ear normal.  Mouth/Throat: Oropharynx is clear and moist. No oropharyngeal exudate.  Eyes: Pupils are equal, round, and reactive to light. Conjunctivae are normal. Right eye exhibits no discharge. Left eye exhibits no discharge. No scleral icterus.  Neck: Neck supple. No JVD present. No tracheal deviation present. No thyromegaly present.  Cardiovascular: Normal rate, regular rhythm and normal heart sounds.  Pulmonary/Chest: Effort normal and breath sounds normal. No stridor.  Abdominal: Soft. Bowel sounds are normal. He exhibits no distension. There is no abdominal tenderness.  There is no rebound and no guarding.  Genitourinary: Rectum:     Guaiac result negative.     No rectal mass, anal fissure, tenderness, external hemorrhoid, internal hemorrhoid or abnormal anal tone.  Prostate is not enlarged and not tender.  Musculoskeletal:        General: No edema.  Lymphadenopathy:    He has no cervical adenopathy.  Neurological: He is alert and oriented to person, place, and time.  Skin: Skin is warm and dry. He is not diaphoretic.  Psychiatric: He has a normal mood and affect. His behavior is normal.    BP 120/78   Pulse 67   Temp (!) 97.4 F (36.3 C) (Oral)   Resp 18   Ht 5' 10.75" (1.797 m)   Wt 179 lb 6.4 oz (81.4 kg)   SpO2 96%   BMI 25.20 kg/m  Wt Readings from Last 3 Encounters:  03/04/19 179 lb 6.4 oz (81.4 kg)  10/25/18 181 lb (82.1 kg)  10/05/18 176 lb 9.6 oz (80.1 kg)     Health Maintenance Due  Topic Date Due  . TETANUS/TDAP  08/05/1969  . COLONOSCOPY  08/05/2000  . PNA vac Low Risk Adult (2 of 2 - PCV13) 09/21/2018    There are no preventive care reminders to display for this patient.  Lab Results  Component Value Date   TSH 2.10 09/22/2017   Lab Results  Component Value Date   WBC 7.5 09/22/2017   HGB 15.3 09/22/2017   HCT 45.4 09/22/2017   MCV 89.3 09/22/2017   PLT 211.0 09/22/2017   Lab Results  Component Value Date   NA 139 09/22/2017   K 4.5 09/22/2017   CO2 28 09/22/2017   GLUCOSE 101 (H) 09/22/2017   BUN 21 09/22/2017   CREATININE 0.93 09/22/2017   BILITOT 1.2 09/22/2017   ALKPHOS 40 09/22/2017   AST 20 09/22/2017   ALT 21 09/22/2017   PROT 6.5 09/22/2017   ALBUMIN 4.4 09/22/2017   CALCIUM 9.1 09/22/2017   GFR 86.10 09/22/2017   Lab Results  Component Value Date   CHOL 202 (H) 09/22/2017   Lab Results  Component Value Date   HDL 76.70 09/22/2017   Lab Results  Component Value Date   LDLCALC 114 (H) 09/22/2017   Lab Results  Component Value Date   TRIG 58.0 09/22/2017   Lab Results  Component  Value Date   CHOLHDL 3 09/22/2017   No results found for: HGBA1C    Assessment & Plan:   Problem List Items Addressed This Visit      Other   Healthcare maintenance - Primary   Relevant Orders   EKG 12-Lead (Completed)   Comprehensive metabolic panel   CBC   Lipid panel  PSA   Urinalysis, Routine w reflex microscopic      No orders of the defined types were placed in this encounter.   Follow-up: Return in about 6 months (around 09/03/2019).   Patient was advised to follow-up in 6 months for recheck.  He was given information on health maintenance and disease prevention.  He was also cleared for surgery today. Libby Maw, MD

## 2019-03-04 NOTE — Progress Notes (Signed)
ty

## 2019-04-25 ENCOUNTER — Ambulatory Visit: Payer: Self-pay | Admitting: Orthopedic Surgery

## 2019-05-02 ENCOUNTER — Telehealth: Payer: Self-pay | Admitting: Family Medicine

## 2019-05-02 NOTE — Telephone Encounter (Addendum)
Caryl Pina w/[facility]/Dr. Lyla Glassing [phone] would like to have the patient's 03/04/2019 office visit for a physical faxed to her ASAP. It has the patient's surgery clearance on it. Please Fax to: [phone]

## 2019-05-03 ENCOUNTER — Ambulatory Visit: Payer: Self-pay | Admitting: Orthopedic Surgery

## 2019-05-03 NOTE — Telephone Encounter (Signed)
OV notes & EKG faxed over to [facility].

## 2019-05-03 NOTE — H&P (Signed)
TOTAL HIP ADMISSION H&P  Patient is admitted for left total hip arthroplasty.  Subjective:  Chief Complaint: left hip pain  HPI: Kerry Bright, 68 y.o. male, has a history of pain and functional disability in the left hip(s) due to arthritis and patient has failed non-surgical conservative treatments for greater than 12 weeks to include NSAID's and/or analgesics, flexibility and strengthening excercises, use of assistive devices and activity modification.  Onset of symptoms was gradual starting 4 years ago with gradually worsening course since that time.The patient noted no past surgery on the left hip(s).  Patient currently rates pain in the left hip at 10 out of 10 with activity. Patient has night pain, worsening of pain with activity and weight bearing, trendelenberg gait, pain that interfers with activities of daily living, pain with passive range of motion and crepitus. Patient has evidence of subchondral cysts, subchondral sclerosis, periarticular osteophytes and joint space narrowing by imaging studies. This condition presents safety issues increasing the risk of falls.  There is no current active infection.  Patient Active Problem List   Diagnosis Date Noted  . Erectile dysfunction due to arterial insufficiency 01/18/2019  . Greater trochanteric bursitis of left hip 10/05/2018  . Need for influenza vaccination 09/21/2017  . Need for 23-polyvalent pneumococcal polysaccharide vaccine 09/21/2017  . Healthcare maintenance 09/21/2017  . Other complicated headache syndrome 09/21/2017   Past Medical History:  Diagnosis Date  . Frequent headaches   . Kidney stones     Past Surgical History:  Procedure Laterality Date  . TONSILLECTOMY      Current Outpatient Medications  Medication Sig Dispense Refill Last Dose  . sildenafil (REVATIO) 20 MG tablet Take 3-5 pills 45 minutes prior daily as needed (Patient not taking: Reported on 04/27/2019) 50 tablet 0 Not Taking at Unknown time   No  current facility-administered medications for this visit.    No Known Allergies  Social History   Tobacco Use  . Smoking status: Former Research scientist (life sciences)  . Smokeless tobacco: Never Used  Substance Use Topics  . Alcohol use: Not on file    Family History  Problem Relation Age of Onset  . Cancer Father   . Cancer Daughter      Review of Systems  Constitutional: Negative.   HENT: Negative.   Eyes: Negative.   Respiratory: Negative.   Cardiovascular: Negative.   Gastrointestinal: Negative.   Genitourinary: Negative.   Musculoskeletal: Positive for joint pain.  Skin: Negative.   Neurological: Negative.   Endo/Heme/Allergies: Negative.   Psychiatric/Behavioral: Negative.     Objective:  Physical Exam  Vitals reviewed. Constitutional: He is oriented to person, place, and time. He appears well-developed and well-nourished.  HENT:  Head: Normocephalic and atraumatic.  Eyes: Pupils are equal, round, and reactive to light. Conjunctivae and EOM are normal.  Neck: Normal range of motion. No thyromegaly present.  Cardiovascular: Normal rate, regular rhythm and intact distal pulses.  Respiratory: Effort normal. No respiratory distress.  GI: Soft. He exhibits no distension.  Genitourinary:    Genitourinary Comments: deferred   Musculoskeletal:     Left hip: He exhibits decreased range of motion and tenderness.  Neurological: He is alert and oriented to person, place, and time. He has normal reflexes.  Skin: Skin is warm and dry.  Psychiatric: He has a normal mood and affect. His behavior is normal. Judgment and thought content normal.    Vital signs in last 24 hours: @VSRANGES @  Labs:   Estimated body mass index is 25.2 kg/m as  calculated from the following:   Height as of 03/04/19: 5' 10.75" (1.797 m).   Weight as of 03/04/19: 81.4 kg.   Imaging Review Plain radiographs demonstrate severe degenerative joint disease of the left hip(s). The bone quality appears to be adequate for  age and reported activity level.      Assessment/Plan:  End stage arthritis, left hip(s)  The patient history, physical examination, clinical judgement of the provider and imaging studies are consistent with end stage degenerative joint disease of the left hip(s) and total hip arthroplasty is deemed medically necessary. The treatment options including medical management, injection therapy, arthroscopy and arthroplasty were discussed at length. The risks and benefits of total hip arthroplasty were presented and reviewed. The risks due to aseptic loosening, infection, stiffness, dislocation/subluxation,  thromboembolic complications and other imponderables were discussed.  The patient acknowledged the explanation, agreed to proceed with the plan and consent was signed. Patient is being admitted for inpatient treatment for surgery, pain control, PT, OT, prophylactic antibiotics, VTE prophylaxis, progressive ambulation and ADL's and discharge planning.The patient is planning to be discharged home with HEP    Patient's anticipated LOS is less than 2 midnights, meeting these requirements: - Younger than 35 - Lives within 1 hour of care - Has a competent adult at home to recover with post-op recover - NO history of  - Chronic pain requiring opiods  - Diabetes  - Coronary Artery Disease  - Heart failure  - Heart attack  - Stroke  - DVT/VTE  - Cardiac arrhythmia  - Respiratory Failure/COPD  - Renal failure  - Anemia  - Advanced Liver disease

## 2019-05-03 NOTE — Patient Instructions (Addendum)
YOU NEED TO HAVE A COVID 19 TEST ON__Saturday August 29th AT 1200 PM, THIS TEST MUST BE DONE BEFORE SURGERY, COME  Kerry Bright, [place] [address] [place] , [id number]. ONCE YOUR COVID TEST IS COMPLETED, PLEASE BEGIN THE QUARANTINE INSTRUCTIONS AS OUTLINED IN YOUR HANDOUT.                Kerry Bright    Your procedure is scheduled on: 05-11-2019   Report to [facility] Main  Entrance    Report to  [place] at 6:30AM   1 VISITOR IS ALLOWED TO WAIT IN WAITING ROOM  ONLY DAY OF YOUR SURGERY. NO VISITORS ARE ALLOWED IN SHORT STAY OR RECOVERY ROOM.   Call this number if you have problems the morning of surgery Kerry Bright AND RINSE YOUR MOUTH OUT, NO CHEWING GUM CANDY OR MINTS.    Do not eat food After Midnight. YOU MAY HAVE CLEAR LIQUIDS FROM MIDNIGHT UNTIL 5:30AM. At 5:30AM Please finish the prescribed Pre-Surgery ENSURE drink. Nothing by mouth after you finish the ENSURE drink !     CLEAR LIQUID DIET   Foods Allowed                                                                     Foods Excluded  Coffee and tea, regular and decaf                             liquids that you cannot  Plain Jell-O any favor except red or purple                                           see through such as: Fruit ices (not with fruit pulp)                                     milk, soups, orange juice  Iced Popsicles                                    All solid food Carbonated beverages, regular and diet                                    Cranberry, grape and apple juices Sports drinks like Gatorade Lightly seasoned clear broth or consume(fat free) Sugar, honey syrup  Sample Menu Breakfast                                Lunch                                     Supper Cranberry juice  Beef broth                            Chicken broth Jell-O                                     Grape juice                           Apple juice Coffee  or tea                        Jell-O                                      Popsicle                                                Coffee or tea                        Coffee or tea  _____________________________________________________________________     Take these medicines the morning of surgery with A SIP OF WATER: NONE                                 You may not have any metal on your body including hair pins and              piercings  Do not wear jewelry, make-up, lotions, powders or perfumes, deodorant                Men may shave face and neck.   Do not bring valuables to the Bright. [place].  Contacts, dentures or bridgework may not be worn into surgery.  Leave suitcase in the car. After surgery it may be brought to your room.       Special Instructions: N/A              Please read over the following fact sheets you were given: _____________________________________________________________________             Kerry Bright - Preparing for Surgery Before surgery, you can play an important role.  Because skin is not sterile, your skin needs to be as free of germs as possible.  You can reduce the number of germs on your skin by washing with CHG (chlorahexidine gluconate) soap before surgery.  CHG is an antiseptic cleaner which kills germs and bonds with the skin to continue killing germs even after washing. Please DO NOT use if you have an allergy to CHG or antibacterial soaps.  If your skin becomes reddened/irritated stop using the CHG and inform your nurse when you arrive at Short Stay. Do not shave (including legs and underarms) for at least 48 hours prior to the first CHG shower.  You may shave your face/neck. Please follow these instructions carefully:  1.  Shower with CHG Soap the night before surgery and the  morning of Surgery.  2.  If you choose to wash your hair, wash your hair first as usual with your  normal   shampoo.  3.  After you shampoo, rinse your hair and body thoroughly to remove the  shampoo.                           4.  Use CHG as you would any other liquid soap.  You can apply chg directly  to the skin and wash                       Gently with a scrungie or clean washcloth.  5.  Apply the CHG Soap to your body ONLY FROM THE NECK DOWN.   Do not use on face/ open                           Wound or open sores. Avoid contact with eyes, ears mouth and genitals (private parts).                       Wash face,  Genitals (private parts) with your normal soap.             6.  Wash thoroughly, paying special attention to the area where your surgery  will be performed.  7.  Thoroughly rinse your body with warm water from the neck down.  8.  DO NOT shower/wash with your normal soap after using and rinsing off  the CHG Soap.                9.  Pat yourself dry with a clean towel.            10.  Wear clean pajamas.            11.  Place clean sheets on your bed the night of your first shower and do not  sleep with pets. Day of Surgery : Do not apply any lotions/deodorants the morning of surgery.  Please wear clean clothes to the [facility]/surgery center.  FAILURE TO FOLLOW THESE INSTRUCTIONS MAY RESULT IN THE CANCELLATION OF YOUR SURGERY PATIENT SIGNATURE_________________________________  NURSE SIGNATURE__________________________________  ________________________________________________________________________   Kerry Bright  An incentive spirometer is a tool that can help keep your lungs clear and active. This tool measures how well you are filling your lungs with each breath. Taking long deep breaths may help reverse or decrease the chance of developing breathing (pulmonary) problems (especially infection) following:  A long period of time when you are unable to move or be active. BEFORE THE PROCEDURE   If the spirometer includes an indicator to show your best effort, your nurse or  respiratory therapist will set it to a desired goal.  If possible, sit up straight or lean slightly forward. Try not to slouch.  Hold the incentive spirometer in an upright position. INSTRUCTIONS FOR USE  1. Sit on the edge of your bed if possible, or sit up as far as you can in bed or on a chair. 2. Hold the incentive spirometer in an upright position. 3. Breathe out normally. 4. Place the mouthpiece in your mouth and seal your lips tightly around it. 5. Breathe in slowly and as deeply as possible, raising the piston or the ball toward the top of the column. 6. Hold your breath for 3-5 seconds or for as long as  possible. Allow the piston or ball to fall to the bottom of the column. 7. Remove the mouthpiece from your mouth and breathe out normally. 8. Rest for a few seconds and repeat Steps 1 through 7 at least 10 times every 1-2 hours when you are awake. Take your time and take a few normal breaths between deep breaths. 9. The spirometer may include an indicator to show your best effort. Use the indicator as a goal to work toward during each repetition. 10. After each set of 10 deep breaths, practice coughing to be sure your lungs are clear. If you have an incision (the cut made at the time of surgery), support your incision when coughing by placing a pillow or rolled up towels firmly against it. Once you are able to get out of bed, walk around indoors and cough well. You may stop using the incentive spirometer when instructed by your caregiver.  RISKS AND COMPLICATIONS  Take your time so you do not get dizzy or light-headed.  If you are in pain, you may need to take or ask for pain medication before doing incentive spirometry. It is harder to take a deep breath if you are having pain. AFTER USE  Rest and breathe slowly and easily.  It can be helpful to keep track of a log of your progress. Your caregiver can provide you with a simple table to help with this. If you are using the  spirometer at home, follow these instructions: [place] IF:   You are having difficultly using the spirometer.  You have trouble using the spirometer as often as instructed.  Your pain medication is not giving enough relief while using the spirometer.  You develop fever of 100.5 F (38.1 C) or higher. SEEK IMMEDIATE MEDICAL CARE IF:   You cough up bloody sputum that had not been present before.  You develop fever of 102 F (38.9 C) or greater.  You develop worsening pain at or near the incision site. MAKE SURE YOU:   Understand these instructions.  Will watch your condition.  Will get help right away if you are not doing well or get worse. Document Released: 01/05/2007 Document Revised: 11/17/2011 Document Reviewed: 03/08/2007 ExitCare Patient Information 2014 ExitCare, [place].   ________________________________________________________________________  WHAT IS A BLOOD TRANSFUSION? Blood Transfusion Information  A transfusion is the replacement of blood or some of its parts. Blood is made up of multiple cells which provide different functions.  Red blood cells carry oxygen and are used for blood loss replacement.  White blood cells fight against infection.  Platelets control bleeding.  Plasma helps clot blood.  Other blood products are available for specialized needs, such as hemophilia or other clotting disorders. BEFORE THE TRANSFUSION  Who gives blood for transfusions?   Healthy volunteers who are fully evaluated to make sure their blood is safe. This is blood bank blood. Transfusion therapy is the safest it has ever been in the practice of medicine. Before blood is taken from a donor, a complete history is taken to make sure that person has no history of diseases nor engages in risky social behavior (examples are intravenous drug use or sexual activity with multiple partners). The donor's travel history is screened to minimize risk of transmitting  infections, such as malaria. The donated blood is tested for signs of infectious diseases, such as HIV and hepatitis. The blood is then tested to be sure it is compatible with you in order to minimize the chance of a  transfusion reaction. If you or a relative donates blood, this is often done in anticipation of surgery and is not appropriate for emergency situations. It takes many days to process the donated blood. RISKS AND COMPLICATIONS Although transfusion therapy is very safe and saves many lives, the main dangers of transfusion include:   Getting an infectious disease.  Developing a transfusion reaction. This is an allergic reaction to something in the blood you were given. Every precaution is taken to prevent this. The decision to have a blood transfusion has been considered carefully by your caregiver before blood is given. Blood is not given unless the benefits outweigh the risks. AFTER THE TRANSFUSION  Right after receiving a blood transfusion, you will usually feel much better and more energetic. This is especially true if your red blood cells have gotten low (anemic). The transfusion raises the level of the red blood cells which carry oxygen, and this usually causes an energy increase.  The nurse administering the transfusion will monitor you carefully for complications. HOME CARE INSTRUCTIONS  No special instructions are needed after a transfusion. You may find your energy is better. Speak with your caregiver about any limitations on activity for underlying diseases you may have. SEEK MEDICAL CARE IF:   Your condition is not improving after your transfusion.  You develop redness or irritation at the intravenous (IV) site. SEEK IMMEDIATE MEDICAL CARE IF:  Any of the following symptoms occur over the next 12 hours:  Shaking chills.  You have a temperature by mouth above 102 F (38.9 C), not controlled by medicine.  Chest, back, or muscle pain.  People around you feel you are  not acting correctly or are confused.  Shortness of breath or difficulty breathing.  Dizziness and fainting.  You get a rash or develop hives.  You have a decrease in urine output.  Your urine turns a dark color or changes to pink, red, or brown. Any of the following symptoms occur over the next 10 days:  You have a temperature by mouth above 102 F (38.9 C), not controlled by medicine.  Shortness of breath.  Weakness after normal activity.  The white part of the eye turns yellow (jaundice).  You have a decrease in the amount of urine or are urinating less often.  Your urine turns a dark color or changes to pink, red, or brown. Document Released: 08/22/2000 Document Revised: 11/17/2011 Document Reviewed: 04/10/2008 [place] [facility] Patient Information 2014 [place], [place].  _______________________________________________________________________

## 2019-05-03 NOTE — H&P (View-Only) (Signed)
TOTAL HIP ADMISSION H&P  Patient is admitted for left total hip arthroplasty.  Subjective:  Chief Complaint: left hip pain  HPI: Kerry Bright, 68 y.o. male, has a history of pain and functional disability in the left hip(s) due to arthritis and patient has failed non-surgical conservative treatments for greater than 12 weeks to include NSAID's and/or analgesics, flexibility and strengthening excercises, use of assistive devices and activity modification.  Onset of symptoms was gradual starting 4 years ago with gradually worsening course since that time.The patient noted no past surgery on the left hip(s).  Patient currently rates pain in the left hip at 10 out of 10 with activity. Patient has night pain, worsening of pain with activity and weight bearing, trendelenberg gait, pain that interfers with activities of daily living, pain with passive range of motion and crepitus. Patient has evidence of subchondral cysts, subchondral sclerosis, periarticular osteophytes and joint space narrowing by imaging studies. This condition presents safety issues increasing the risk of falls.  There is no current active infection.  Patient Active Problem List   Diagnosis Date Noted  . Erectile dysfunction due to arterial insufficiency 01/18/2019  . Greater trochanteric bursitis of left hip 10/05/2018  . Need for influenza vaccination 09/21/2017  . Need for 23-polyvalent pneumococcal polysaccharide vaccine 09/21/2017  . Healthcare maintenance 09/21/2017  . Other complicated headache syndrome 09/21/2017   Past Medical History:  Diagnosis Date  . Frequent headaches   . Kidney stones     Past Surgical History:  Procedure Laterality Date  . TONSILLECTOMY      Current Outpatient Medications  Medication Sig Dispense Refill Last Dose  . sildenafil (REVATIO) 20 MG tablet Take 3-5 pills 45 minutes prior daily as needed (Patient not taking: Reported on 04/27/2019) 50 tablet 0 Not Taking at Unknown time   No  current facility-administered medications for this visit.    No Known Allergies  Social History   Tobacco Use  . Smoking status: Former Research scientist (life sciences)  . Smokeless tobacco: Never Used  Substance Use Topics  . Alcohol use: Not on file    Family History  Problem Relation Age of Onset  . Cancer Father   . Cancer Daughter      Review of Systems  Constitutional: Negative.   HENT: Negative.   Eyes: Negative.   Respiratory: Negative.   Cardiovascular: Negative.   Gastrointestinal: Negative.   Genitourinary: Negative.   Musculoskeletal: Positive for joint pain.  Skin: Negative.   Neurological: Negative.   Endo/Heme/Allergies: Negative.   Psychiatric/Behavioral: Negative.     Objective:  Physical Exam  Vitals reviewed. Constitutional: He is oriented to person, place, and time. He appears well-developed and well-nourished.  HENT:  Head: Normocephalic and atraumatic.  Eyes: Pupils are equal, round, and reactive to light. Conjunctivae and EOM are normal.  Neck: Normal range of motion. No thyromegaly present.  Cardiovascular: Normal rate, regular rhythm and intact distal pulses.  Respiratory: Effort normal. No respiratory distress.  GI: Soft. He exhibits no distension.  Genitourinary:    Genitourinary Comments: deferred   Musculoskeletal:     Left hip: He exhibits decreased range of motion and tenderness.  Neurological: He is alert and oriented to person, place, and time. He has normal reflexes.  Skin: Skin is warm and dry.  Psychiatric: He has a normal mood and affect. His behavior is normal. Judgment and thought content normal.    Vital signs in last 24 hours: @VSRANGES @  Labs:   Estimated body mass index is 25.2 kg/m as  calculated from the following:   Height as of 03/04/19: 5' 10.75" (1.797 m).   Weight as of 03/04/19: 81.4 kg.   Imaging Review Plain radiographs demonstrate severe degenerative joint disease of the left hip(s). The bone quality appears to be adequate for  age and reported activity level.      Assessment/Plan:  End stage arthritis, left hip(s)  The patient history, physical examination, clinical judgement of the provider and imaging studies are consistent with end stage degenerative joint disease of the left hip(s) and total hip arthroplasty is deemed medically necessary. The treatment options including medical management, injection therapy, arthroscopy and arthroplasty were discussed at length. The risks and benefits of total hip arthroplasty were presented and reviewed. The risks due to aseptic loosening, infection, stiffness, dislocation/subluxation,  thromboembolic complications and other imponderables were discussed.  The patient acknowledged the explanation, agreed to proceed with the plan and consent was signed. Patient is being admitted for inpatient treatment for surgery, pain control, PT, OT, prophylactic antibiotics, VTE prophylaxis, progressive ambulation and ADL's and discharge planning.The patient is planning to be discharged home with HEP    Patient's anticipated LOS is less than 2 midnights, meeting these requirements: - Younger than 63 - Lives within 1 hour of care - Has a competent adult at home to recover with post-op recover - NO history of  - Chronic pain requiring opiods  - Diabetes  - Coronary Artery Disease  - Heart failure  - Heart attack  - Stroke  - DVT/VTE  - Cardiac arrhythmia  - Respiratory Failure/COPD  - Renal failure  - Anemia  - Advanced Liver disease

## 2019-05-04 ENCOUNTER — Encounter (HOSPITAL_COMMUNITY): Payer: Self-pay

## 2019-05-04 ENCOUNTER — Encounter (HOSPITAL_COMMUNITY)
Admission: RE | Admit: 2019-05-04 | Discharge: 2019-05-04 | Disposition: A | Discharge status: Home / Self Care | Payer: PPO | Source: Ambulatory Visit | Attending: Orthopedic Surgery | Admitting: Orthopedic Surgery

## 2019-05-04 ENCOUNTER — Other Ambulatory Visit: Payer: Self-pay

## 2019-05-04 DIAGNOSIS — M1612 Unilateral primary osteoarthritis, left hip: Secondary | ICD-10-CM | POA: Insufficient documentation

## 2019-05-04 DIAGNOSIS — Z01812 Encounter for preprocedural laboratory examination: Secondary | ICD-10-CM | POA: Diagnosis not present

## 2019-05-04 DIAGNOSIS — Z20828 Contact with and (suspected) exposure to other viral communicable diseases: Secondary | ICD-10-CM | POA: Insufficient documentation

## 2019-05-04 HISTORY — DX: Unspecified osteoarthritis, unspecified site: M19.90

## 2019-05-04 HISTORY — DX: Personal history of urinary calculi: Z87.442

## 2019-05-04 LAB — CBC
HCT: 41.8 % (ref 39.0–52.0)
Hemoglobin: 13.6 g/dL (ref 13.0–17.0)
MCH: 28.8 pg (ref 26.0–34.0)
MCHC: 32.5 g/dL (ref 30.0–36.0)
MCV: 88.6 fL (ref 80.0–100.0)
Platelets: 229 10*3/uL (ref 150–400)
RBC: 4.72 MIL/uL (ref 4.22–5.81)
RDW: 13.5 % (ref 11.5–15.5)
WBC: 5.6 10*3/uL (ref 4.0–10.5)
nRBC: 0 % (ref 0.0–0.2)

## 2019-05-04 LAB — COMPREHENSIVE METABOLIC PANEL
ALT: 19 U/L (ref 0–44)
AST: 21 U/L (ref 15–41)
Albumin: 3.9 g/dL (ref 3.5–5.0)
Alkaline Phosphatase: 46 U/L (ref 38–126)
Anion gap: 8 (ref 5–15)
BUN: 16 mg/dL (ref 8–23)
CO2: 25 mmol/L (ref 22–32)
Calcium: 9.2 mg/dL (ref 8.9–10.3)
Chloride: 105 mmol/L (ref 98–111)
Creatinine, Ser: 0.7 mg/dL (ref 0.61–1.24)
GFR calc Af Amer: >60 mL/min (ref >60)
GFR calc non Af Amer: >60 mL/min (ref >60)
Glucose, Bld: 104 mg/dL — ABNORMAL HIGH (ref 70–99)
Potassium: 4.1 mmol/L (ref 3.5–5.1)
Sodium: 138 mmol/L (ref 135–145)
Total Bilirubin: 0.8 mg/dL (ref 0.3–1.2)
Total Protein: 6.7 g/dL (ref 6.5–8.1)

## 2019-05-04 LAB — URINALYSIS, ROUTINE W REFLEX MICROSCOPIC
Bilirubin Urine: NEGATIVE
Glucose, UA: NEGATIVE mg/dL
Hgb urine dipstick: NEGATIVE
Ketones, ur: NEGATIVE mg/dL
Leukocytes,Ua: NEGATIVE
Nitrite: NEGATIVE
Protein, ur: NEGATIVE mg/dL
Specific Gravity, Urine: 1.01 (ref 1.005–1.030)
pH: 6 (ref 5.0–8.0)

## 2019-05-04 LAB — PROTIME-INR
INR: 0.9 (ref 0.8–1.2)
Prothrombin Time: 11.6 seconds (ref 11.4–15.2)

## 2019-05-04 LAB — SURGICAL PCR SCREEN
MRSA, PCR: NEGATIVE
Staphylococcus aureus: NEGATIVE

## 2019-05-04 LAB — ABO/RH: ABO/RH(D): B POS

## 2019-05-04 NOTE — Progress Notes (Signed)
  PCP -  DR [place] CLEARANCE 03-04-19 EPIC Cardiologist -   Chest x-ray -  EKG - 03-04-19 EPIC Stress Test -  ECHO -  Cardiac Cath -  LABS DONE 05-04-19: CBC, CMET PT, UA, TYPE AND SCREEN  Sleep Study -  CPAP -   Fasting Blood Sugar -  Checks Blood Sugar _____ times a day  Blood Thinner Instructions: Aspirin Instructions: Last Dose:  Anesthesia review:   Patient denies shortness of breath, fever, cough and chest pain at PAT appointment   Patient verbalized understanding of instructions that were given to them at the PAT appointment. Patient was also instructed that they will need to review over the PAT instructions again at home before surgery.

## 2019-05-07 ENCOUNTER — Other Ambulatory Visit (HOSPITAL_COMMUNITY)
Admission: RE | Admit: 2019-05-07 | Discharge: 2019-05-07 | Disposition: A | Discharge status: Home / Self Care | Payer: PPO | Source: Ambulatory Visit | Attending: Orthopedic Surgery | Admitting: Orthopedic Surgery

## 2019-05-07 DIAGNOSIS — Z01812 Encounter for preprocedural laboratory examination: Secondary | ICD-10-CM | POA: Diagnosis not present

## 2019-05-07 LAB — SARS CORONAVIRUS 2 (TAT 6-24 HRS): SARS Coronavirus 2: NEGATIVE

## 2019-05-11 ENCOUNTER — Inpatient Hospital Stay (HOSPITAL_COMMUNITY)
Admission: RE | Admit: 2019-05-11 | Discharge: 2019-05-12 | DRG: 470 | Disposition: A | Discharge status: Home / Self Care | Payer: PPO | Attending: Orthopedic Surgery | Admitting: Orthopedic Surgery

## 2019-05-11 ENCOUNTER — Inpatient Hospital Stay (HOSPITAL_COMMUNITY): Payer: PPO

## 2019-05-11 ENCOUNTER — Encounter (HOSPITAL_COMMUNITY): Payer: Self-pay | Admitting: General Practice

## 2019-05-11 ENCOUNTER — Other Ambulatory Visit: Payer: Self-pay

## 2019-05-11 ENCOUNTER — Encounter (HOSPITAL_COMMUNITY)
Admission: RE | Disposition: A | Discharge status: Home / Self Care | Payer: Self-pay | Source: Home / Self Care | Attending: Orthopedic Surgery

## 2019-05-11 ENCOUNTER — Inpatient Hospital Stay (HOSPITAL_COMMUNITY): Payer: PPO | Admitting: Registered Nurse

## 2019-05-11 ENCOUNTER — Inpatient Hospital Stay (HOSPITAL_COMMUNITY): Payer: PPO | Admitting: Physician Assistant

## 2019-05-11 DIAGNOSIS — M25752 Osteophyte, left hip: Secondary | ICD-10-CM | POA: Diagnosis present

## 2019-05-11 DIAGNOSIS — Z471 Aftercare following joint replacement surgery: Secondary | ICD-10-CM | POA: Diagnosis not present

## 2019-05-11 DIAGNOSIS — Z87891 Personal history of nicotine dependence: Secondary | ICD-10-CM | POA: Diagnosis not present

## 2019-05-11 DIAGNOSIS — Z9181 History of falling: Secondary | ICD-10-CM

## 2019-05-11 DIAGNOSIS — M8568 Other cyst of bone, other site: Secondary | ICD-10-CM | POA: Diagnosis not present

## 2019-05-11 DIAGNOSIS — M1612 Unilateral primary osteoarthritis, left hip: Secondary | ICD-10-CM | POA: Diagnosis not present

## 2019-05-11 DIAGNOSIS — Z419 Encounter for procedure for purposes other than remedying health state, unspecified: Secondary | ICD-10-CM

## 2019-05-11 DIAGNOSIS — Z87442 Personal history of urinary calculi: Secondary | ICD-10-CM

## 2019-05-11 DIAGNOSIS — N5201 Erectile dysfunction due to arterial insufficiency: Secondary | ICD-10-CM | POA: Diagnosis not present

## 2019-05-11 DIAGNOSIS — M7062 Trochanteric bursitis, left hip: Secondary | ICD-10-CM | POA: Diagnosis not present

## 2019-05-11 DIAGNOSIS — Z96642 Presence of left artificial hip joint: Secondary | ICD-10-CM | POA: Diagnosis not present

## 2019-05-11 DIAGNOSIS — Z09 Encounter for follow-up examination after completed treatment for conditions other than malignant neoplasm: Secondary | ICD-10-CM

## 2019-05-11 HISTORY — PX: TOTAL HIP ARTHROPLASTY: SHX124

## 2019-05-11 LAB — TYPE AND SCREEN
ABO/RH(D): B POS
Antibody Screen: NEGATIVE

## 2019-05-11 SURGERY — ARTHROPLASTY, HIP, TOTAL, ANTERIOR APPROACH
Anesthesia: Spinal | Laterality: Left

## 2019-05-11 MED ORDER — MIDAZOLAM HCL 5 MG/5ML IJ SOLN
INTRAMUSCULAR | Status: DC | PRN
  Administered 2019-05-11 (×2): 1 mg via INTRAVENOUS

## 2019-05-11 MED ORDER — KETOROLAC TROMETHAMINE 15 MG/ML IJ SOLN
7.5000 mg | Freq: Four times a day (QID) | INTRAMUSCULAR | Status: AC
  Administered 2019-05-11 – 2019-05-12 (×3): 7.5 mg via INTRAVENOUS
  Filled 2019-05-11 (×3): qty 1

## 2019-05-11 MED ORDER — MORPHINE SULFATE (PF) 2 MG/ML IV SOLN: 0.5000 mg | INTRAVENOUS | Status: DC | PRN

## 2019-05-11 MED ORDER — CHLORHEXIDINE GLUCONATE 4 % EX LIQD: 60.0000 mL | Freq: Once | CUTANEOUS | Status: DC

## 2019-05-11 MED ORDER — PROPOFOL 500 MG/50ML IV EMUL
INTRAVENOUS | Status: DC | PRN
  Administered 2019-05-11: 75 ug/kg/min via INTRAVENOUS

## 2019-05-11 MED ORDER — MIDAZOLAM HCL 2 MG/2ML IJ SOLN
INTRAMUSCULAR | Status: AC
  Filled 2019-05-11: qty 2

## 2019-05-11 MED ORDER — KETOROLAC TROMETHAMINE 30 MG/ML IJ SOLN
INTRAMUSCULAR | Status: AC
  Filled 2019-05-11: qty 1

## 2019-05-11 MED ORDER — EPHEDRINE 5 MG/ML INJ
INTRAVENOUS | Status: AC
  Filled 2019-05-11: qty 10

## 2019-05-11 MED ORDER — ISOPROPYL ALCOHOL 70 % SOLN
Status: AC
  Filled 2019-05-11: qty 480

## 2019-05-11 MED ORDER — SODIUM CHLORIDE 0.9 % IR SOLN
Status: DC | PRN
  Administered 2019-05-11: 1000 mL
  Administered 2019-05-11: 3000 mL
  Administered 2019-05-11: 1000 mL

## 2019-05-11 MED ORDER — CEFAZOLIN SODIUM-DEXTROSE 2-4 GM/100ML-% IV SOLN
2.0000 g | INTRAVENOUS | Status: AC
  Administered 2019-05-11: 2 g via INTRAVENOUS
  Filled 2019-05-11: qty 100

## 2019-05-11 MED ORDER — BUPIVACAINE-EPINEPHRINE (PF) 0.5% -1:200000 IJ SOLN
INTRAMUSCULAR | Status: AC
  Filled 2019-05-11: qty 30

## 2019-05-11 MED ORDER — BUPIVACAINE-EPINEPHRINE 0.25% -1:200000 IJ SOLN
INTRAMUSCULAR | Status: DC | PRN
  Administered 2019-05-11: 30 mL

## 2019-05-11 MED ORDER — HYDROCODONE-ACETAMINOPHEN 5-325 MG PO TABS
1.0000 | ORAL_TABLET | ORAL | Status: DC | PRN
  Administered 2019-05-11 (×2): 2 via ORAL
  Filled 2019-05-11 (×3): qty 2

## 2019-05-11 MED ORDER — MENTHOL 3 MG MT LOZG: 1.0000 | LOZENGE | OROMUCOSAL | Status: DC | PRN

## 2019-05-11 MED ORDER — PROPOFOL 10 MG/ML IV BOLUS
INTRAVENOUS | Status: AC
  Filled 2019-05-11: qty 80

## 2019-05-11 MED ORDER — ONDANSETRON HCL 4 MG/2ML IJ SOLN: 4.0000 mg | Freq: Four times a day (QID) | INTRAMUSCULAR | Status: DC | PRN

## 2019-05-11 MED ORDER — ONDANSETRON HCL 4 MG PO TABS: 4.0000 mg | ORAL_TABLET | Freq: Four times a day (QID) | ORAL | Status: DC | PRN

## 2019-05-11 MED ORDER — FENTANYL CITRATE (PF) 100 MCG/2ML IJ SOLN
INTRAMUSCULAR | Status: DC | PRN
  Administered 2019-05-11 (×2): 50 ug via INTRAVENOUS

## 2019-05-11 MED ORDER — POVIDONE-IODINE 10 % EX SWAB: 2.0000 "application " | Freq: Once | CUTANEOUS | Status: AC

## 2019-05-11 MED ORDER — DEXAMETHASONE SODIUM PHOSPHATE 10 MG/ML IJ SOLN
INTRAMUSCULAR | Status: DC | PRN
  Administered 2019-05-11: 10 mg via INTRAVENOUS

## 2019-05-11 MED ORDER — METHOCARBAMOL 500 MG PO TABS: 500.0000 mg | ORAL_TABLET | Freq: Four times a day (QID) | ORAL | Status: DC | PRN

## 2019-05-11 MED ORDER — SODIUM CHLORIDE 0.9 % IV SOLN
INTRAVENOUS | Status: DC
  Administered 2019-05-11: 12:00:00 via INTRAVENOUS

## 2019-05-11 MED ORDER — CEFAZOLIN SODIUM-DEXTROSE 2-4 GM/100ML-% IV SOLN
2.0000 g | Freq: Four times a day (QID) | INTRAVENOUS | Status: AC
  Administered 2019-05-11 (×2): 2 g via INTRAVENOUS
  Filled 2019-05-11 (×2): qty 100

## 2019-05-11 MED ORDER — ONDANSETRON HCL 4 MG/2ML IJ SOLN
INTRAMUSCULAR | Status: DC | PRN
  Administered 2019-05-11: 4 mg via INTRAVENOUS

## 2019-05-11 MED ORDER — EPHEDRINE SULFATE-NACL 50-0.9 MG/10ML-% IV SOSY
PREFILLED_SYRINGE | INTRAVENOUS | Status: DC | PRN
  Administered 2019-05-11 (×2): 15 mg via INTRAVENOUS

## 2019-05-11 MED ORDER — DEXAMETHASONE SODIUM PHOSPHATE 10 MG/ML IJ SOLN
10.0000 mg | Freq: Once | INTRAMUSCULAR | Status: AC
  Administered 2019-05-12: 09:00:00 10 mg via INTRAVENOUS
  Filled 2019-05-11: qty 1

## 2019-05-11 MED ORDER — ISOPROPYL ALCOHOL 70 % SOLN
Status: DC | PRN
  Administered 2019-05-11: 1 via TOPICAL

## 2019-05-11 MED ORDER — ACETAMINOPHEN 10 MG/ML IV SOLN
1000.0000 mg | INTRAVENOUS | Status: AC
  Administered 2019-05-11: 09:00:00 1000 mg via INTRAVENOUS
  Filled 2019-05-11: qty 100

## 2019-05-11 MED ORDER — PROPOFOL 10 MG/ML IV BOLUS
INTRAVENOUS | Status: DC | PRN
  Administered 2019-05-11 (×3): 20 mg via INTRAVENOUS

## 2019-05-11 MED ORDER — METHOCARBAMOL 500 MG IVPB - SIMPLE MED
500.0000 mg | Freq: Four times a day (QID) | INTRAVENOUS | Status: DC | PRN
  Filled 2019-05-11: qty 50

## 2019-05-11 MED ORDER — ONDANSETRON HCL 4 MG/2ML IJ SOLN
INTRAMUSCULAR | Status: AC
  Filled 2019-05-11: qty 2

## 2019-05-11 MED ORDER — SODIUM CHLORIDE (PF) 0.9 % IJ SOLN
INTRAMUSCULAR | Status: DC | PRN
  Administered 2019-05-11: 30 mL via INTRAVENOUS

## 2019-05-11 MED ORDER — HYDROCODONE-ACETAMINOPHEN 7.5-325 MG PO TABS: 1.0000 | ORAL_TABLET | ORAL | Status: DC | PRN

## 2019-05-11 MED ORDER — POVIDONE-IODINE 10 % EX SWAB: 2.0000 "application " | Freq: Once | CUTANEOUS | Status: DC

## 2019-05-11 MED ORDER — ALUM & MAG HYDROXIDE-SIMETH 200-200-20 MG/5ML PO SUSP: 30.0000 mL | ORAL | Status: DC | PRN

## 2019-05-11 MED ORDER — SENNA 8.6 MG PO TABS
1.0000 | ORAL_TABLET | Freq: Two times a day (BID) | ORAL | Status: DC
  Administered 2019-05-11 – 2019-05-12 (×2): 8.6 mg via ORAL
  Filled 2019-05-11 (×2): qty 1

## 2019-05-11 MED ORDER — METOCLOPRAMIDE HCL 5 MG PO TABS: 5.0000 mg | ORAL_TABLET | Freq: Three times a day (TID) | ORAL | Status: DC | PRN

## 2019-05-11 MED ORDER — LIDOCAINE 2% (20 MG/ML) 5 ML SYRINGE
INTRAMUSCULAR | Status: AC
  Filled 2019-05-11: qty 5

## 2019-05-11 MED ORDER — METOCLOPRAMIDE HCL 5 MG/ML IJ SOLN: 5.0000 mg | Freq: Three times a day (TID) | INTRAMUSCULAR | Status: DC | PRN

## 2019-05-11 MED ORDER — SODIUM CHLORIDE (PF) 0.9 % IJ SOLN
INTRAMUSCULAR | Status: AC
  Filled 2019-05-11: qty 50

## 2019-05-11 MED ORDER — BUPIVACAINE IN DEXTROSE 0.75-8.25 % IT SOLN
INTRATHECAL | Status: DC | PRN
  Administered 2019-05-11: 2 mL via INTRATHECAL

## 2019-05-11 MED ORDER — POLYETHYLENE GLYCOL 3350 17 G PO PACK: 17.0000 g | PACK | Freq: Every day | ORAL | Status: DC | PRN

## 2019-05-11 MED ORDER — TRANEXAMIC ACID-NACL 1000-0.7 MG/100ML-% IV SOLN
1000.0000 mg | INTRAVENOUS | Status: AC
  Administered 2019-05-11: 1000 mg via INTRAVENOUS
  Filled 2019-05-11: qty 100

## 2019-05-11 MED ORDER — DEXAMETHASONE SODIUM PHOSPHATE 10 MG/ML IJ SOLN
INTRAMUSCULAR | Status: AC
  Filled 2019-05-11: qty 1

## 2019-05-11 MED ORDER — PHENYLEPHRINE 40 MCG/ML (10ML) SYRINGE FOR IV PUSH (FOR BLOOD PRESSURE SUPPORT)
PREFILLED_SYRINGE | INTRAVENOUS | Status: AC
  Filled 2019-05-11: qty 10

## 2019-05-11 MED ORDER — FENTANYL CITRATE (PF) 100 MCG/2ML IJ SOLN
INTRAMUSCULAR | Status: AC
  Filled 2019-05-11: qty 2

## 2019-05-11 MED ORDER — LIDOCAINE 2% (20 MG/ML) 5 ML SYRINGE
INTRAMUSCULAR | Status: DC | PRN
  Administered 2019-05-11: 40 mg via INTRAVENOUS

## 2019-05-11 MED ORDER — ACETAMINOPHEN 325 MG PO TABS: 325.0000 mg | ORAL_TABLET | Freq: Four times a day (QID) | ORAL | Status: DC | PRN

## 2019-05-11 MED ORDER — KETOROLAC TROMETHAMINE 30 MG/ML IJ SOLN
INTRAMUSCULAR | Status: DC | PRN
  Administered 2019-05-11: 30 mg

## 2019-05-11 MED ORDER — SODIUM CHLORIDE 0.9 % IV SOLN: INTRAVENOUS | Status: DC

## 2019-05-11 MED ORDER — LACTATED RINGERS IV SOLN
INTRAVENOUS | Status: DC
  Administered 2019-05-11 (×2): via INTRAVENOUS

## 2019-05-11 MED ORDER — DIPHENHYDRAMINE HCL 12.5 MG/5ML PO ELIX
12.5000 mg | ORAL_SOLUTION | ORAL | Status: DC | PRN
  Filled 2019-05-11: qty 10

## 2019-05-11 MED ORDER — DOCUSATE SODIUM 100 MG PO CAPS
100.0000 mg | ORAL_CAPSULE | Freq: Two times a day (BID) | ORAL | Status: DC
  Administered 2019-05-11 – 2019-05-12 (×2): 100 mg via ORAL
  Filled 2019-05-11 (×2): qty 1

## 2019-05-11 MED ORDER — PHENOL 1.4 % MT LIQD
1.0000 | OROMUCOSAL | Status: DC | PRN
  Filled 2019-05-11: qty 177

## 2019-05-11 MED ORDER — PHENYLEPHRINE 40 MCG/ML (10ML) SYRINGE FOR IV PUSH (FOR BLOOD PRESSURE SUPPORT)
PREFILLED_SYRINGE | INTRAVENOUS | Status: DC | PRN
  Administered 2019-05-11: 80 ug via INTRAVENOUS

## 2019-05-11 MED ORDER — ASPIRIN 81 MG PO CHEW
81.0000 mg | CHEWABLE_TABLET | Freq: Two times a day (BID) | ORAL | Status: DC
  Administered 2019-05-11 – 2019-05-12 (×2): 81 mg via ORAL
  Filled 2019-05-11 (×2): qty 1

## 2019-05-11 SURGICAL SUPPLY — 62 items
ADH SKN CLS APL DERMABOND .7 (GAUZE/BANDAGES/DRESSINGS) ×2
APL PRP STRL LF DISP 70% ISPRP (MISCELLANEOUS) ×1
BAG DECANTER FOR FLEXI CONT (MISCELLANEOUS) IMPLANT
BAG SPEC THK2 15X12 ZIP CLS (MISCELLANEOUS)
BAG ZIPLOCK 12X15 (MISCELLANEOUS) IMPLANT
BLADE SURG SZ10 CARB STEEL (BLADE) IMPLANT
CHLORAPREP W/TINT 26 (MISCELLANEOUS) ×2 IMPLANT
COVER PERINEAL POST (MISCELLANEOUS) ×2 IMPLANT
COVER SURGICAL LIGHT HANDLE (MISCELLANEOUS) ×2 IMPLANT
COVER WAND RF STERILE (DRAPES) IMPLANT
CUP ACET PNNCL SECTR W/GRIP 56 (Hips) IMPLANT
DECANTER SPIKE VIAL GLASS SM (MISCELLANEOUS) ×3 IMPLANT
DERMABOND ADVANCED (GAUZE/BANDAGES/DRESSINGS) ×2
DERMABOND ADVANCED .7 DNX12 (GAUZE/BANDAGES/DRESSINGS) ×2 IMPLANT
DRAPE IMP U-DRAPE 54X76 (DRAPES) ×2 IMPLANT
DRAPE SHEET LG 3/4 BI-LAMINATE (DRAPES) ×6 IMPLANT
DRAPE STERI IOBAN 125X83 (DRAPES) IMPLANT
DRAPE U-SHAPE 47X51 STRL (DRAPES) ×4 IMPLANT
DRESSING AQUACEL AG SP 3.5X10 (GAUZE/BANDAGES/DRESSINGS) IMPLANT
DRSG AQUACEL AG ADV 3.5X10 (GAUZE/BANDAGES/DRESSINGS) ×2 IMPLANT
DRSG AQUACEL AG SP 3.5X10 (GAUZE/BANDAGES/DRESSINGS) ×2
ELECT PENCIL ROCKER SW 15FT (MISCELLANEOUS) ×2 IMPLANT
ELECT REM PT RETURN 15FT ADLT (MISCELLANEOUS) ×2 IMPLANT
GAUZE SPONGE 4X4 12PLY STRL (GAUZE/BANDAGES/DRESSINGS) ×2 IMPLANT
GLOVE BIO SURGEON STRL SZ8.5 (GLOVE) ×4 IMPLANT
GLOVE BIOGEL PI IND STRL 8.5 (GLOVE) ×1 IMPLANT
GLOVE BIOGEL PI INDICATOR 8.5 (GLOVE) ×1
GOWN SPEC L3 XXLG W/TWL (GOWN DISPOSABLE) ×2 IMPLANT
HANDPIECE INTERPULSE COAX TIP (DISPOSABLE) ×2
HEAD CERAMIC 36 PLUS5 (Hips) ×1 IMPLANT
HOLDER FOLEY CATH W/STRAP (MISCELLANEOUS) ×2 IMPLANT
HOOD PEEL AWAY FLYTE STAYCOOL (MISCELLANEOUS) ×8 IMPLANT
JET LAVAGE IRRISEPT WOUND (IRRIGATION / IRRIGATOR) ×2
KIT TURNOVER KIT A (KITS) IMPLANT
LAVAGE JET IRRISEPT WOUND (IRRIGATION / IRRIGATOR) ×1 IMPLANT
LINER NEUTRAL 52MMX36MMX56N (Liner) ×1 IMPLANT
MANIFOLD NEPTUNE II (INSTRUMENTS) ×2 IMPLANT
MARKER SKIN DUAL TIP RULER LAB (MISCELLANEOUS) ×2 IMPLANT
NDL SAFETY ECLIPSE 18X1.5 (NEEDLE) ×1 IMPLANT
NDL SPNL 18GX3.5 QUINCKE PK (NEEDLE) ×1 IMPLANT
NEEDLE HYPO 18GX1.5 SHARP (NEEDLE) ×2
NEEDLE SPNL 18GX3.5 QUINCKE PK (NEEDLE) ×2 IMPLANT
PACK ANTERIOR HIP CUSTOM (KITS) ×2 IMPLANT
PINN SECTOR W/GRIP ACE CUP 56 (Hips) ×2 IMPLANT
SAW OSC TIP CART 19.5X105X1.3 (SAW) ×2 IMPLANT
SEALER BIPOLAR AQUA 6.0 (INSTRUMENTS) ×2 IMPLANT
SET HNDPC FAN SPRY TIP SCT (DISPOSABLE) ×1 IMPLANT
STEM TRI LOC BPS SZ6 W GRIPTON IMPLANT
SUT ETHIBOND NAB CT1 #1 30IN (SUTURE) ×4 IMPLANT
SUT MNCRL AB 3-0 PS2 18 (SUTURE) ×2 IMPLANT
SUT MNCRL AB 4-0 PS2 18 (SUTURE) ×2 IMPLANT
SUT MON AB 2-0 CT1 36 (SUTURE) ×4 IMPLANT
SUT STRATAFIX PDO 1 14 VIOLET (SUTURE) ×2
SUT STRATFX PDO 1 14 VIOLET (SUTURE) ×1
SUT VIC AB 2-0 CT1 27 (SUTURE) ×2
SUT VIC AB 2-0 CT1 TAPERPNT 27 (SUTURE) ×1 IMPLANT
SUTURE STRATFX PDO 1 14 VIOLET (SUTURE) ×1 IMPLANT
SYR 3ML LL SCALE MARK (SYRINGE) ×2 IMPLANT
TRAY FOLEY MTR SLVR 16FR STAT (SET/KITS/TRAYS/PACK) ×1 IMPLANT
TRI LOC BPS SZ 6 W GRIPTON ×2 IMPLANT
WATER STERILE IRR 1000ML POUR (IV SOLUTION) ×2 IMPLANT
YANKAUER SUCT BULB TIP 10FT TU (MISCELLANEOUS) ×2 IMPLANT

## 2019-05-11 NOTE — Anesthesia Procedure Notes (Signed)
Date/Time: 05/11/2019 8:39 AM Performed by: Talbot Grumbling, CRNA Oxygen Delivery Method: Simple face mask

## 2019-05-11 NOTE — Discharge Instructions (Signed)
°Dr. Denielle Bayard °Joint Replacement Specialist °[facility] °[address] °, [place] [address] °[phone] ° ° °TOTAL HIP REPLACEMENT POSTOPERATIVE DIRECTIONS ° ° ° °Hip Rehabilitation, Guidelines Following Surgery  ° °WEIGHT BEARING °Weight bearing as tolerated with assist device (walker, cane, etc) as directed, use it as long as suggested by your surgeon or therapist, typically at least 4-6 weeks. ° °The results of a hip operation are greatly improved after range of motion and muscle strengthening exercises. Follow all safety measures which are given to protect your hip. If any of these exercises cause increased pain or swelling in your joint, decrease the amount until you are comfortable again. Then slowly increase the exercises. Call your caregiver if you have problems or questions.  ° °HOME CARE INSTRUCTIONS  °Most of the following instructions are designed to prevent the dislocation of your new hip.  °Remove items at home which could result in a fall. This includes throw rugs or furniture in walking pathways.  °Continue medications as instructed at time of discharge. °· You may have some home medications which will be placed on hold until you complete the course of blood thinner medication. °· You may start showering once you are discharged home. Do not remove your dressing. °Do not put on socks or shoes without following the instructions of your caregivers.   °Sit on chairs with arms. Use the chair arms to help push yourself up when arising.  °Arrange for the use of a toilet seat elevator so you are not sitting low.  °· Walk with walker as instructed.  °You may resume a sexual relationship in one month or when given the OK by your caregiver.  °Use walker as long as suggested by your caregivers.  °You may put full weight on your legs and walk as much as is comfortable. °Avoid periods of inactivity such as sitting longer than an hour when not asleep. This helps prevent  blood clots.  °You may return to work once you are cleared by your surgeon.  °Do not drive a car for 6 weeks or until released by your surgeon.  °Do not drive while taking narcotics.  °Wear elastic stockings for two weeks following surgery during the day but you may remove then at night.  °Make sure you keep all of your appointments after your operation with all of your doctors and caregivers. You should call the office at the above phone number and make an appointment for approximately two weeks after the date of your surgery. °Please pick up a stool softener and laxative for home use as long as you are requiring pain medications. °· ICE to the affected hip every three hours for 30 minutes at a time and then as needed for pain and swelling. Continue to use ice on the hip for pain and swelling from surgery. You may notice swelling that will progress down to the foot and ankle.  This is normal after surgery.  Elevate the leg when you are not up walking on it.   °It is important for you to complete the blood thinner medication as prescribed by your doctor. °· Continue to use the breathing machine which will help keep your temperature down.  It is common for your temperature to cycle up and down following surgery, especially at night when you are not up moving around and exerting yourself.  The breathing machine keeps your lungs expanded and your temperature down. ° °RANGE OF MOTION AND STRENGTHENING EXERCISES  °These exercises are   designed to help you keep full movement of your hip joint. Follow your caregiver's or physical therapist's instructions. Perform all exercises about fifteen times, three times per day or as directed. Exercise both hips, even if you have had only one joint replacement. These exercises can be done on a training (exercise) mat, on the floor, on a table or on a bed. Use whatever works the best and is most comfortable for you. Use music or television while you are exercising so that the exercises  are a pleasant break in your day. This will make your life better with the exercises acting as a break in routine you can look forward to.  °Lying on your back, slowly slide your foot toward your buttocks, raising your knee up off the floor. Then slowly slide your foot back down until your leg is straight again.  °Lying on your back spread your legs as far apart as you can without causing discomfort.  °Lying on your side, raise your upper leg and foot straight up from the floor as far as is comfortable. Slowly lower the leg and repeat.  °Lying on your back, tighten up the muscle in the front of your thigh (quadriceps muscles). You can do this by keeping your leg straight and trying to raise your heel off the floor. This helps strengthen the largest muscle supporting your knee.  °Lying on your back, tighten up the muscles of your buttocks both with the legs straight and with the knee bent at a comfortable angle while keeping your heel on the floor.  ° °SKILLED REHAB INSTRUCTIONS: °If the patient is transferred to a skilled rehab facility following release from the hospital, a list of the current medications will be sent to the facility for the patient to continue.  When discharged from the skilled rehab facility, please have the facility set up the patient's Home Health Physical Therapy prior to being released. Also, the skilled facility will be responsible for providing the patient with their medications at time of release from the facility to include their pain medication and their blood thinner medication. If the patient is still at the rehab facility at time of the two week follow up appointment, the skilled rehab facility will also need to assist the patient in arranging follow up appointment in our office and any transportation needs. ° °MAKE SURE YOU:  °Understand these instructions.  °Will watch your condition.  °Will get help right away if you are not doing well or get worse. ° °Pick up stool softner and  laxative for home use following surgery while on pain medications. °Do not remove your dressing. °The dressing is waterproof--it is OK to take showers. °Continue to use ice for pain and swelling after surgery. °Do not use any lotions or creams on the incision until instructed by your surgeon. °Total Hip Protocol. ° ° °

## 2019-05-11 NOTE — Op Note (Signed)
OPERATIVE REPORT  SURGEON: Rod Can, MD   ASSISTANT: Nehemiah Massed, PA-C.  PREOPERATIVE DIAGNOSIS: Left hip arthritis.   POSTOPERATIVE DIAGNOSIS: Left hip arthritis.   PROCEDURE: Left total hip arthroplasty, anterior approach.   IMPLANTS: DePuy Tri Lock stem, size 6, hi offset. DePuy Pinnacle Cup, size 56 mm. DePuy Altrx liner, size 36 by 56 mm, neutral. DePuy Biolox ceramic head ball, size 36 + 5 mm.  ANESTHESIA:  Spinal  ESTIMATED BLOOD LOSS:-300 mL    ANTIBIOTICS: 2 g Ancef.  DRAINS: None.  COMPLICATIONS: None.   CONDITION: PACU - hemodynamically stable.   BRIEF CLINICAL NOTE: Kerry Bright is a 67 y.o. male with a long-standing history of Left hip arthritis. After failing conservative management, the patient was indicated for total hip arthroplasty. The risks, benefits, and alternatives to the procedure were explained, and the patient elected to proceed.  PROCEDURE IN DETAIL: Surgical site was marked by myself in the pre-op holding area. Once inside the operating room, spinal anesthesia was obtained, and a foley catheter was inserted. The patient was then positioned on the Hana table.  All bony prominences were well padded.  The hip was prepped and draped in the normal sterile surgical fashion.  A time-out was called verifying side and site of surgery. The patient received IV antibiotics within 60 minutes of beginning the procedure.   The direct anterior approach to the hip was performed through the Hueter interval.  Lateral femoral circumflex vessels were treated with the Auqumantys. The anterior capsule was exposed and an inverted T capsulotomy was made. The femoral neck cut was made to the level of the templated cut.  A corkscrew was placed into the head and the head was removed.  The femoral head was found to have eburnated bone. The head was passed to the back table and was measured.   Acetabular exposure was achieved, and the pulvinar and labrum were  excised. Sequential reaming of the acetabulum was then performed up to a size 55 mm reamer. A 56 mm cup was then opened and impacted into place at approximately 40 degrees of abduction and 20 degrees of anteversion. The final polyethylene liner was impacted into place and acetabular osteophytes were removed.    I then gained femoral exposure taking care to protect the abductors and greater trochanter.  This was performed using standard external rotation, extension, and adduction.  The capsule was peeled off the inner aspect of the greater trochanter, taking care to preserve the short external rotators. A cookie cutter was used to enter the femoral canal, and then the femoral canal finder was placed.  Sequential broaching was performed up to a size 6.  Calcar planer was used on the femoral neck remnant.  I placed a hi offset neck and a trial head ball.  The hip was reduced.  Leg lengths and offset were checked fluoroscopically.  The hip was dislocated and trial components were removed.  The final implants were placed, and the hip was reduced.  Fluoroscopy was used to confirm component position and leg lengths.  At 90 degrees of external rotation and full extension, the hip was stable to an anterior directed force.   The wound was copiously irrigated with Irrisept solution and normal saline using pule lavage.  Marcaine solution was injected into the periarticular soft tissue.  The wound was closed in layers using #1 Stratafix for the fascia, 2-0 Vicryl for the subcutaneous fat, 2-0 Monocryl for the deep dermal layer, 3-0 running Monocryl subcuticular stitch, and Dermabond  for the skin.  Once the glue was fully dried, an Aquacell Ag dressing was applied.  The patient was transported to the recovery room in stable condition.  Sponge, needle, and instrument counts were correct at the end of the case x2.  The patient tolerated the procedure well and there were no known complications.  Please note that a surgical  assistant was a medical necessity for this procedure to perform it in a safe and expeditious manner. Assistant was necessary to provide appropriate retraction of vital neurovascular structures, to prevent femoral fracture, and to allow for anatomic placement of the prosthesis.

## 2019-05-11 NOTE — Transfer of Care (Signed)
Immediate Anesthesia Transfer of Care Note  Patient: Kerry Bright  Procedure(s) Performed: TOTAL HIP ARTHROPLASTY ANTERIOR APPROACH (Left )  Patient Location: PACU  Anesthesia Type:Spinal  Level of Consciousness: awake, alert  and oriented  Airway & Oxygen Therapy: Patient Spontanous Breathing and Patient connected to face mask oxygen  Post-op Assessment: Report given to RN and Post -op Vital signs reviewed and stable  Post vital signs: Reviewed and stable  Last Vitals:  Vitals Value Taken Time  BP    Temp    Pulse 63 05/11/19 1055  Resp 14 05/11/19 1055  SpO2 100 % 05/11/19 1055  Vitals shown include unvalidated device data.  Last Pain:  Vitals:   05/11/19 0701  TempSrc: Oral  PainSc:          Complications: No apparent anesthesia complications

## 2019-05-11 NOTE — Plan of Care (Signed)
Plan of care reviewed and discussed with the patient. 

## 2019-05-11 NOTE — Interval H&P Note (Signed)
History and Physical Interval Note:  05/11/2019 8:26 AM  Kerry Bright  has presented today for surgery, with the diagnosis of Degenerative joint disease left hip.  The various methods of treatment have been discussed with the patient and family. After consideration of risks, benefits and other options for treatment, the patient has consented to  Procedure(s): TOTAL HIP ARTHROPLASTY ANTERIOR APPROACH (Left) as a surgical intervention.  The patient's history has been reviewed, patient examined, no change in status, stable for surgery.  I have reviewed the patient's chart and labs.  Questions were answered to the patient's satisfaction.     Hilton Cork Courteny Egler

## 2019-05-11 NOTE — Anesthesia Preprocedure Evaluation (Signed)
Anesthesia Evaluation  Patient identified by MRN, date of birth, ID band Patient awake    Reviewed: Allergy & Precautions, NPO status , Patient's Chart, lab work & pertinent test results  Airway Mallampati: I  TM Distance: >3 FB Neck ROM: Full    Dental   Pulmonary former smoker,    Pulmonary exam normal        Cardiovascular Normal cardiovascular exam     Neuro/Psych    GI/Hepatic   Endo/Other    Renal/GU      Musculoskeletal   Abdominal   Peds  Hematology   Anesthesia Other Findings   Reproductive/Obstetrics                             Anesthesia Physical Anesthesia Plan  ASA: II  Anesthesia Plan: Spinal   Post-op Pain Management:    Induction: Intravenous  PONV Risk Score and Plan: 1 and Ondansetron and Treatment may vary due to age or medical condition  Airway Management Planned: Simple Face Mask  Additional Equipment:   Intra-op Plan:   Post-operative Plan:   Informed Consent: I have reviewed the patients History and Physical, chart, labs and discussed the procedure including the risks, benefits and alternatives for the proposed anesthesia with the patient or authorized representative who has indicated his/her understanding and acceptance.       Plan Discussed with: CRNA and Surgeon  Anesthesia Plan Comments:         Anesthesia Quick Evaluation

## 2019-05-11 NOTE — Evaluation (Signed)
Physical Therapy Evaluation Patient Details Name: Kerry Bright MRN: [id number] DOB: 01/23/1951 Today's Date: 05/11/2019   History of Present Illness  68 yo male s/p L DA-THA on 05/11/19. PMH includes trochanteric bursitis, headaches.  Clinical Impression  Pt presents with L hip pain, decreased L hip strength post-operatively, increased time and effort to perform mobility tasks, and decreased activity tolerance. Pt to benefit from acute PT to address deficits. Pt ambulated 100 ft with RW with min guard assist, verbal cuing for form and safety provided throughout. Pt educated on ankle pumps (20/hour) to perform this afternoon/evening to increase circulation, to pt's tolerance and limited by pain. PT to progress mobility as tolerated, and will continue to follow acutely.        Follow Up Recommendations Follow surgeon's recommendation for DC plan and follow-up therapies;Supervision for mobility/OOB(HEP)    Equipment Recommendations  Rolling walker with 5" wheels;3in1 (PT)(ordered, per pt and pt's wife)    Recommendations for Other Services       Precautions / Restrictions Precautions Precautions: Fall Restrictions Weight Bearing Restrictions: No LLE Weight Bearing: Weight bearing as tolerated      Mobility  Bed Mobility Overal bed mobility: Needs Assistance Bed Mobility: Supine to Sit     Supine to sit: Min guard;HOB elevated     General bed mobility comments: Min guard for safety, verbal cuing for sequencing.  Transfers Overall transfer level: Needs assistance Equipment used: Rolling walker (2 wheeled) Transfers: Sit to/from Stand Sit to Stand: Min guard;From elevated surface         General transfer comment: Min guard for safety, verbal cuing for hand placement when rising.  Ambulation/Gait Ambulation/Gait assistance: Min guard Gait Distance (Feet): 100 Feet Assistive device: Rolling walker (2 wheeled) Gait Pattern/deviations: Step-to pattern;Step-through  pattern;Decreased stride length Gait velocity: decr   General Gait Details: Min guard for safety, verbal cuing for sequencing, placement in RW, upright posture and looking forward.  Stairs            Wheelchair Mobility    Modified Rankin (Stroke Patients Only)       Balance Overall balance assessment: Mild deficits observed, not formally tested                                           Pertinent Vitals/Pain Pain Assessment: 0-10 Pain Score: 2  Pain Location: L hip Pain Descriptors / Indicators: Sore Pain Intervention(s): Limited activity within patient's tolerance;Monitored during session;Premedicated before session;Repositioned    Home Living Family/patient expects to be discharged to:: Private residence Living Arrangements: Spouse/significant other Available Help at Discharge: Family;Available 24 hours/day Type of Home: House Home Access: Stairs to enter   [employer] of Steps: 1+1+1 Home Layout: Able to live on main level with bedroom/bathroom Home Equipment: Crutches;Shower seat - built in      Prior Function Level of Independence: Independent               Hand Dominance   Dominant Hand: Left    Extremity/Trunk Assessment   Upper Extremity Assessment Upper Extremity Assessment: Overall WFL for tasks assessed    Lower Extremity Assessment Lower Extremity Assessment: Overall WFL for tasks assessed;LLE deficits/detail LLE Deficits / Details: suspected post-surgical weakness; able to perform quad set, heel slide, ankle pump LLE Sensation: WNL    Cervical / Trunk Assessment Cervical / Trunk Assessment: Normal  Communication   Communication: No difficulties  Cognition Arousal/Alertness: Awake/alert Behavior During Therapy: WFL for tasks assessed/performed Overall Cognitive Status: Within Functional Limits for tasks assessed                                        General Comments       Exercises     Assessment/Plan    PT Assessment Patient needs continued PT services  PT Problem List Decreased strength;Decreased mobility;Decreased range of motion;Decreased activity tolerance;Decreased balance;Decreased knowledge of use of DME;Pain       PT Treatment Interventions DME instruction;Therapeutic activities;Gait training;Therapeutic exercise;Patient/family education;Balance training;Stair training;Functional mobility training    PT Goals (Current goals can be found in the Care Plan section)  Acute Rehab PT Goals Patient Stated Goal: go home tomorrow am PT Goal Formulation: With patient Time For Goal Achievement: 05/18/19 Potential to Achieve Goals: Good    Frequency 7X/week   Barriers to discharge        Co-evaluation               AM-PAC PT "6 Clicks" Mobility  Outcome Measure Help needed turning from your back to your side while in a flat bed without using bedrails?: A Little Help needed moving from lying on your back to sitting on the side of a flat bed without using bedrails?: A Little Help needed moving to and from a bed to a chair (including a wheelchair)?: A Little Help needed standing up from a chair using your arms (e.g., wheelchair or bedside chair)?: A Little Help needed to walk in hospital room?: A Little Help needed climbing 3-5 steps with a railing? : A Little 6 Click Score: 18    End of Session Equipment Utilized During Treatment: Gait belt Activity Tolerance: Patient tolerated treatment well Patient left: in chair;with chair alarm set;with call bell/phone within reach;with SCD's reapplied Nurse Communication: Mobility status PT Visit Diagnosis: Other abnormalities of gait and mobility (R26.89);Difficulty in walking, not elsewhere classified (R26.2)    Time: [id number] PT Time Calculation (min) (ACUTE ONLY): 22 min   Charges:   PT Evaluation $PT Eval Low Complexity: 1 Low          Nusaiba Guallpa Conception Chancy, PT Acute Rehabilitation  Services Pager [phone]  Office [phone]   Carlethia Mesquita D Maela [employer] 05/11/2019, 5:01 PM

## 2019-05-11 NOTE — Anesthesia Procedure Notes (Signed)
Spinal  Patient location during procedure: OR Start time: 05/11/2019 8:37 AM End time: 05/11/2019 8:40 AM Staffing Anesthesiologist: Lillia Abed, MD Performed: anesthesiologist  Preanesthetic Checklist Completed: patient identified, surgical consent, pre-op evaluation, timeout performed, IV checked, risks and benefits discussed and monitors and equipment checked Spinal Block Patient position: sitting Prep: DuraPrep Patient monitoring: heart rate, cardiac monitor, continuous pulse ox and blood pressure Approach: right paramedian Location: L3-4 Injection technique: single-shot Needle Needle type: Pencan  Needle gauge: 24 G Needle length: 9 cm Needle insertion depth: 7 cm

## 2019-05-11 NOTE — Anesthesia Postprocedure Evaluation (Signed)
Anesthesia Post Note  Patient: Kerry Bright  Procedure(s) Performed: TOTAL HIP ARTHROPLASTY ANTERIOR APPROACH (Left )     Patient location during evaluation: PACU Anesthesia Type: Spinal Level of consciousness: oriented and awake and alert Pain management: pain level controlled Vital Signs Assessment: post-procedure vital signs reviewed and stable Respiratory status: spontaneous breathing, respiratory function stable and patient connected to nasal cannula oxygen Cardiovascular status: blood pressure returned to baseline and stable Postop Assessment: no headache, no backache and no apparent nausea or vomiting Anesthetic complications: no    Last Vitals:  Vitals:   05/11/19 1219 05/11/19 1327  BP: 124/62 126/67  Pulse: (!) 55 60  Resp: 18 18  Temp:  (!) 36.4 C  SpO2: 99% 99%    Last Pain:  Vitals:   05/11/19 1327  TempSrc: Oral  PainSc:                  [name] DAVID

## 2019-05-12 ENCOUNTER — Encounter (HOSPITAL_COMMUNITY): Payer: Self-pay | Admitting: Orthopedic Surgery

## 2019-05-12 LAB — CBC
HCT: 36.2 % — ABNORMAL LOW (ref 39.0–52.0)
Hemoglobin: 11.8 g/dL — ABNORMAL LOW (ref 13.0–17.0)
MCH: 29.8 pg (ref 26.0–34.0)
MCHC: 32.6 g/dL (ref 30.0–36.0)
MCV: 91.4 fL (ref 80.0–100.0)
Platelets: 195 10*3/uL (ref 150–400)
RBC: 3.96 MIL/uL — ABNORMAL LOW (ref 4.22–5.81)
RDW: 13.6 % (ref 11.5–15.5)
WBC: 11.1 10*3/uL — ABNORMAL HIGH (ref 4.0–10.5)
nRBC: 0 % (ref 0.0–0.2)

## 2019-05-12 LAB — BASIC METABOLIC PANEL
Anion gap: 7 (ref 5–15)
BUN: 16 mg/dL (ref 8–23)
CO2: 22 mmol/L (ref 22–32)
Calcium: 8.3 mg/dL — ABNORMAL LOW (ref 8.9–10.3)
Chloride: 109 mmol/L (ref 98–111)
Creatinine, Ser: 0.6 mg/dL — ABNORMAL LOW (ref 0.61–1.24)
GFR calc Af Amer: >60 mL/min (ref >60)
GFR calc non Af Amer: >60 mL/min (ref >60)
Glucose, Bld: 128 mg/dL — ABNORMAL HIGH (ref 70–99)
Potassium: 3.9 mmol/L (ref 3.5–5.1)
Sodium: 138 mmol/L (ref 135–145)

## 2019-05-12 MED ORDER — SENNA 8.6 MG PO TABS: 1.0000 | ORAL_TABLET | Freq: Two times a day (BID) | ORAL | 0 refills | Status: DC

## 2019-05-12 MED ORDER — ASPIRIN 81 MG PO CHEW: 81.0000 mg | CHEWABLE_TABLET | Freq: Two times a day (BID) | ORAL | 1 refills | Status: DC

## 2019-05-12 MED ORDER — DOCUSATE SODIUM 100 MG PO CAPS: 100.0000 mg | ORAL_CAPSULE | Freq: Two times a day (BID) | ORAL | 1 refills | Status: DC

## 2019-05-12 MED ORDER — HYDROCODONE-ACETAMINOPHEN 5-325 MG PO TABS: 1.0000 | ORAL_TABLET | ORAL | 0 refills | Status: DC | PRN

## 2019-05-12 MED ORDER — ONDANSETRON HCL 4 MG PO TABS: 4.0000 mg | ORAL_TABLET | Freq: Four times a day (QID) | ORAL | 0 refills | Status: DC | PRN

## 2019-05-12 NOTE — Progress Notes (Signed)
Physical Therapy Treatment Patient Details Name: Kenyata Sittler MRN: [id number] DOB: 06-21-1951 Today's Date: 05/12/2019    History of Present Illness 68 yo male s/p L DA-THA on 05/11/19. PMH includes trochanteric bursitis, headaches.    PT Comments    Pt with good gait distance this session, and performed stair navigation proficiently. Pt tolerated all THA LE exercises well, handout administered and reviewed thoroughly. Pt with no further questions, ready to d/c from PT standpoint.    Follow Up Recommendations  Follow surgeon's recommendation for DC plan and follow-up therapies;Supervision for mobility/OOB(HEP)     Equipment Recommendations  Rolling walker with 5" wheels;3in1 (PT)(delivered to room during PT session)    Recommendations for Other Services       Precautions / Restrictions Precautions Precautions: Fall Restrictions Weight Bearing Restrictions: No LLE Weight Bearing: Weight bearing as tolerated    Mobility  Bed Mobility Overal bed mobility: Modified Independent Bed Mobility: Supine to Sit;Sit to Supine     Supine to sit: HOB elevated;Modified independent (Device/Increase time) Sit to supine: Modified independent (Device/Increase time);HOB elevated   General bed mobility comments: Mod I for increased time, no physical assist required.  Transfers Overall transfer level: Needs assistance Equipment used: Rolling walker (2 wheeled) Transfers: Sit to/from Stand Sit to Stand: From elevated surface;Supervision         General transfer comment: supervision for safety, pt with proper hand placement when rising.  Ambulation/Gait Ambulation/Gait assistance: Supervision Gait Distance (Feet): 300 Feet(150+150 ft) Assistive device: Rolling walker (2 wheeled) Gait Pattern/deviations: Step-through pattern;Decreased stride length;Trunk flexed Gait velocity: decr   General Gait Details: supervision for safety, min verbal cuing for upright  posture.   Stairs Stairs: Yes Stairs assistance: Min guard Stair Management: Two rails;Step to pattern;Forwards Number of Stairs: 3 General stair comments: min guard for safety, verbal cuing for sequencing (ascending with RLE, descending with LLE). Pt wanted to practice for going into and out of basement. Pt also performed 1 step navigation with RW with forward pattern, verbal cuing for bringing feet and RW close to step prior to putting RW onto step.   Wheelchair Mobility    Modified Rankin (Stroke Patients Only)       Balance Overall balance assessment: Mild deficits observed, not formally tested                                          Cognition Arousal/Alertness: Awake/alert Behavior During Therapy: WFL for tasks assessed/performed Overall Cognitive Status: Within Functional Limits for tasks assessed                                        Exercises Total Joint Exercises Ankle Circles/Pumps: AROM;Both;10 reps;Supine Quad Sets: AROM;Left;10 reps;Supine Short Arc Quad: AROM;Left;10 reps;Supine Heel Slides: AROM;Left;5 reps;Supine Hip ABduction/ADduction: AROM;Left;5 reps;Supine;Standing Long Arc Quad: AROM;Left;5 reps;Seated Knee Flexion: AROM;Left;10 reps;Standing Marching in Standing: AROM;Left;10 reps;Standing    General Comments        Pertinent Vitals/Pain Pain Assessment: 0-10 Pain Score: 3  Pain Location: L hip Pain Descriptors / Indicators: Sore Pain Intervention(s): Limited activity within patient's tolerance;Monitored during session;Repositioned;Ice applied    Home Living                      Prior Function  PT Goals (current goals can now be found in the care plan section) Acute Rehab PT Goals Patient Stated Goal: go home tomorrow am PT Goal Formulation: With patient Time For Goal Achievement: 05/18/19 Potential to Achieve Goals: Good Progress towards PT goals: Progressing toward goals     Frequency    7X/week      PT Plan Current plan remains appropriate    Co-evaluation              AM-PAC PT "6 Clicks" Mobility   Outcome Measure  Help needed turning from your back to your side while in a flat bed without using bedrails?: None Help needed moving from lying on your back to sitting on the side of a flat bed without using bedrails?: None Help needed moving to and from a bed to a chair (including a wheelchair)?: None Help needed standing up from a chair using your arms (e.g., wheelchair or bedside chair)?: A Little Help needed to walk in hospital room?: A Little Help needed climbing 3-5 steps with a railing? : A Little 6 Click Score: 21    End of Session Equipment Utilized During Treatment: Gait belt Activity Tolerance: Patient tolerated treatment well Patient left: in bed;with call bell/phone within reach Nurse Communication: Mobility status PT Visit Diagnosis: Other abnormalities of gait and mobility (R26.89);Difficulty in walking, not elsewhere classified (R26.2)     Time: [id number] PT Time Calculation (min) (ACUTE ONLY): 25 min  Charges:  $Gait Training: 8-22 mins $Therapeutic Exercise: 8-22 mins                     Julien Girt, PT Acute Rehabilitation Services Pager [phone]  Office [place] 05/12/2019, 10:48 AM

## 2019-05-12 NOTE — Progress Notes (Signed)
    Subjective:  Patient reports pain as mild.  Denies N/V/CP/SOB. Wants to go home.  Objective:   VITALS:   Vitals:   05/11/19 2154 05/12/19 0118 05/12/19 0518 05/12/19 0852  BP: 121/69 123/60 120/73 124/64  Pulse: 63 (!) 57 65 65  Resp: 18 16 18 16   Temp: (!) 97.5 F (36.4 C) (!) 97.5 F (36.4 C) 97.6 F (36.4 C) (!) 96.7 F (35.9 C)  TempSrc: Oral Oral Oral Axillary  SpO2: 98% 99% 100% 98%  Weight:      Height:        NAD ABD soft Sensation intact distally Intact pulses distally Dorsiflexion/Plantar flexion intact Incision: dressing C/D/I Compartment soft   Lab Results  Component Value Date   WBC 11.1 (H) 05/12/2019   HGB 11.8 (L) 05/12/2019   HCT 36.2 (L) 05/12/2019   MCV 91.4 05/12/2019   PLT 195 05/12/2019   BMET    Component Value Date/Time   NA 138 05/12/2019 0252   K 3.9 05/12/2019 0252   CL 109 05/12/2019 0252   CO2 22 05/12/2019 0252   GLUCOSE 128 (H) 05/12/2019 0252   BUN 16 05/12/2019 0252   CREATININE 0.60 (L) 05/12/2019 0252   CALCIUM 8.3 (L) 05/12/2019 0252   GFRNONAA >60 05/12/2019 0252   GFRAA >60 05/12/2019 0252     Assessment/Plan: 1 Day Post-Op   Principal Problem:   Osteoarthritis of left hip   WBAT with walker DVT ppx: Aspirin, SCDs, TEDS PO pain control PT/OT Dispo: D/C home with HEP   Hilton Cork Harith Mccadden 05/12/2019, 11:56 AM   Rod Can, MD Cell: [phone] [place] [facility] now [facility]  [place] [address], [address], [place], [place] [address] Phone: [phone] [email or web address] Facebook  [employer]

## 2019-05-12 NOTE — Discharge Summary (Signed)
Physician Discharge Summary  Patient ID: Kerry Bright MRN: [id number] DOB/AGE: July 24, 1951 68 y.o.  Admit date: 05/11/2019 Discharge date: 05/12/2019  Admission Diagnoses:  Osteoarthritis of left hip  Discharge Diagnoses:  Principal Problem:   Osteoarthritis of left hip   Past Medical History:  Diagnosis Date  . Arthritis    oa  . History of kidney stones     Surgeries: Procedure(s): TOTAL HIP ARTHROPLASTY ANTERIOR APPROACH on 05/11/2019   Consultants (if any):   Discharged Condition: Improved  Hospital Course: Kerry Bright is an 68 y.o. male who was admitted 05/11/2019 with a diagnosis of Osteoarthritis of left hip and went to the operating room on 05/11/2019 and underwent the above named procedures.    He was given perioperative antibiotics:  Anti-infectives (From admission, onward)   Start     Dose/Rate Route Frequency Ordered Stop   05/11/19 1430  ceFAZolin (ANCEF) IVPB 2g/100 mL premix     2 g 200 mL/hr over 30 Minutes Intravenous Every 6 hours 05/11/19 1222 05/11/19 2109   05/11/19 0645  ceFAZolin (ANCEF) IVPB 2g/100 mL premix     2 g 200 mL/hr over 30 Minutes Intravenous On call to O.R. 05/11/19 [id number] 05/11/19 0849    .  He was given sequential compression devices, early ambulation, and ASA for DVT prophylaxis.  He benefited maximally from the hospital stay and there were no complications.    Recent vital signs:  Vitals:   05/12/19 0518 05/12/19 0852  BP: 120/73 124/64  Pulse: 65 65  Resp: 18 16  Temp: 97.6 F (36.4 C) (!) 96.7 F (35.9 C)  SpO2: 100% 98%    Recent laboratory studies:  Lab Results  Component Value Date   HGB 11.8 (L) 05/12/2019   HGB 13.6 05/04/2019   HGB 14.1 03/04/2019   Lab Results  Component Value Date   WBC 11.1 (H) 05/12/2019   PLT 195 05/12/2019   Lab Results  Component Value Date   INR 0.9 05/04/2019   Lab Results  Component Value Date   NA 138 05/12/2019   K 3.9 05/12/2019   CL 109 05/12/2019   CO2 22 05/12/2019    BUN 16 05/12/2019   CREATININE 0.60 (L) 05/12/2019   GLUCOSE 128 (H) 05/12/2019    Discharge Medications:   Allergies as of 05/12/2019   No Known Allergies     Medication List    STOP taking these medications   sildenafil 20 MG tablet Commonly known as: Revatio     TAKE these medications   aspirin 81 MG chewable tablet Chew 1 tablet (81 mg total) by mouth 2 (two) times daily.   docusate sodium 100 MG capsule Commonly known as: COLACE Take 1 capsule (100 mg total) by mouth 2 (two) times daily.   HYDROcodone-acetaminophen 5-325 MG tablet Commonly known as: NORCO/VICODIN Take 1 tablet by mouth every 4 (four) hours as needed for moderate pain (pain score 4-6).   ondansetron 4 MG tablet Commonly known as: ZOFRAN Take 1 tablet (4 mg total) by mouth every 6 (six) hours as needed for nausea.   senna 8.6 MG Tabs tablet Commonly known as: SENOKOT Take 1 tablet (8.6 mg total) by mouth 2 (two) times daily.       Diagnostic Studies: Dg Pelvis Portable  Result Date: 05/11/2019 CLINICAL DATA:  LEFT total hip arthroplasty EXAM: PORTABLE PELVIS 1-2 VIEWS COMPARISON:  None. FINDINGS: LEFT total hip arthroplasty noted without complicating features. IMPRESSION: LEFT total hip arthroplasty without complicating features. Electronically Signed  By: Margarette Canada M.D.   On: 05/11/2019 11:27   Dg C-arm 1-60 Min-no Report  Result Date: 05/11/2019 Fluoroscopy was utilized by the requesting physician.  No radiographic interpretation.   Dg Hip Operative Unilat W Or W/o Pelvis Left  Result Date: 05/11/2019 CLINICAL DATA:  LEFT total hip arthroplasty EXAM: OPERATIVE LEFT HIP (WITH PELVIS IF PERFORMED) 3 VIEWS TECHNIQUE: Fluoroscopic spot image(s) were submitted for interpretation post-operatively. COMPARISON:  None. FINDINGS: Intraoperative spot views demonstrate interval LEFT total hip arthroplasty. No gross complicating features noted. IMPRESSION: LEFT total hip arthroplasty.  No gross complicating  features. Electronically Signed   By: Margarette Canada M.D.   On: 05/11/2019 10:44    Disposition: Discharge disposition: 01-Home or Self Care       Discharge Instructions    Call MD / Call 911   Complete by: As directed    If you experience chest pain or shortness of breath, CALL 911 and be transported to the hospital emergency room.  If you develope a fever above 101 F, pus (white drainage) or increased drainage or redness at the wound, or calf pain, call your surgeon's office.   Constipation Prevention   Complete by: As directed    Drink plenty of fluids.  Prune juice may be helpful.  You may use a stool softener, such as Colace (over the counter) 100 mg twice a day.  Use MiraLax (over the counter) for constipation as needed.   Diet - low sodium heart healthy   Complete by: As directed    Driving restrictions   Complete by: As directed    No driving for 4 weeks   Increase activity slowly as tolerated   Complete by: As directed    Lifting restrictions   Complete by: As directed    No lifting for 6 weeks   TED hose   Complete by: As directed    Use stockings (TED hose) for 2 weeks on both leg(s).  You may remove them at night for sleeping.      Follow-up Information    Natacha Jepsen, Aaron Edelman, MD. Schedule an appointment as soon as possible for a visit in 2 weeks.   Specialty: Orthopedic Surgery Why: For wound re-check Contact information: [address] [place] [address] [phone]            Signed: Hilton Cork Danyeal Akens 05/12/2019, 11:59 AM

## 2019-05-24 DIAGNOSIS — Z471 Aftercare following joint replacement surgery: Secondary | ICD-10-CM | POA: Diagnosis not present

## 2019-05-24 DIAGNOSIS — Z96642 Presence of left artificial hip joint: Secondary | ICD-10-CM | POA: Diagnosis not present

## 2019-05-27 DIAGNOSIS — I8393 Asymptomatic varicose veins of bilateral lower extremities: Secondary | ICD-10-CM | POA: Diagnosis not present

## 2019-05-27 DIAGNOSIS — L570 Actinic keratosis: Secondary | ICD-10-CM | POA: Diagnosis not present

## 2019-05-27 DIAGNOSIS — D229 Melanocytic nevi, unspecified: Secondary | ICD-10-CM | POA: Diagnosis not present

## 2019-05-27 DIAGNOSIS — D2339 Other benign neoplasm of skin of other parts of face: Secondary | ICD-10-CM | POA: Diagnosis not present

## 2019-05-27 DIAGNOSIS — L814 Other melanin hyperpigmentation: Secondary | ICD-10-CM | POA: Diagnosis not present

## 2019-06-21 DIAGNOSIS — Z471 Aftercare following joint replacement surgery: Secondary | ICD-10-CM | POA: Diagnosis not present

## 2019-06-21 DIAGNOSIS — Z96642 Presence of left artificial hip joint: Secondary | ICD-10-CM | POA: Diagnosis not present

## 2019-09-06 ENCOUNTER — Ambulatory Visit: Payer: PPO | Admitting: Family Medicine

## 2019-11-17 ENCOUNTER — Telehealth: Payer: Self-pay | Admitting: Family Medicine

## 2019-11-17 NOTE — Telephone Encounter (Signed)
Attempted to schedule AWV. Unable to LVM.  Will try at later time.  

## 2019-12-25 DIAGNOSIS — L239 Allergic contact dermatitis, unspecified cause: Secondary | ICD-10-CM | POA: Diagnosis not present

## 2020-02-18 IMAGING — RF DG C-ARM 1-60 MIN-NO REPORT
1 series · 6 of 6 positions shown · non-contrast
Comparison: None.

CLINICAL DATA: LEFT total hip arthroplasty

EXAM:
OPERATIVE LEFT HIP (WITH PELVIS IF PERFORMED) 3 VIEWS
TECHNIQUE: Fluoroscopic spot image(s) were submitted for interpretation
post-operatively.

[Series 1: unknown protocol · 0.20mm/px · 6 of 6 slices shown]
[im 1/6]
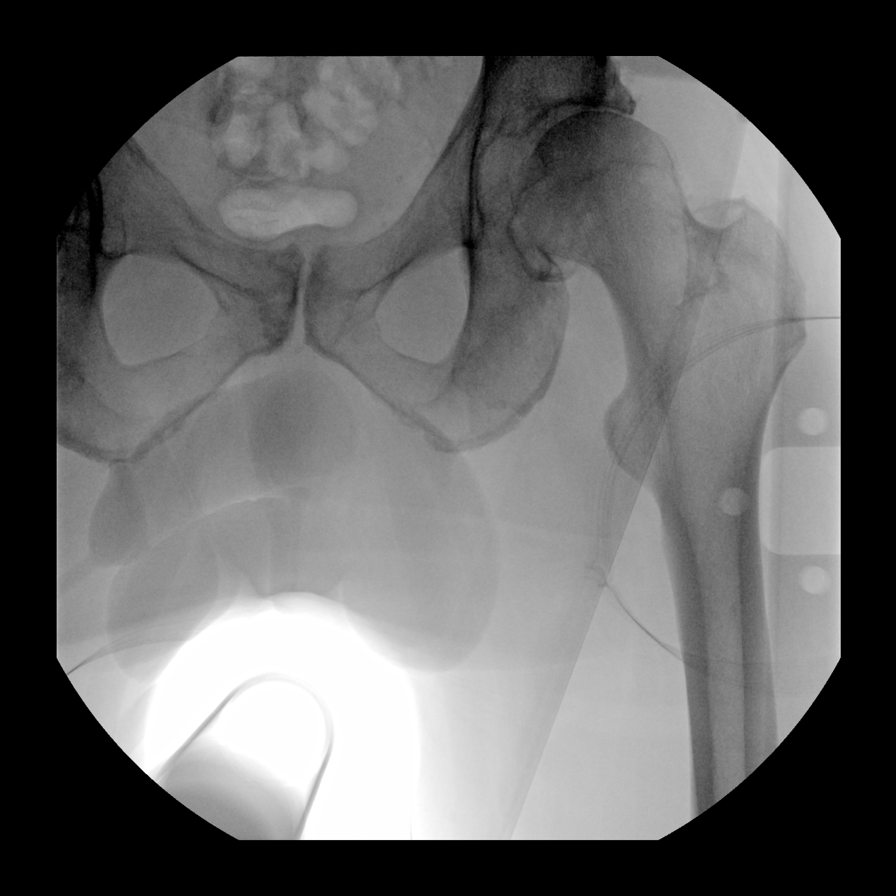
[im 2/6]
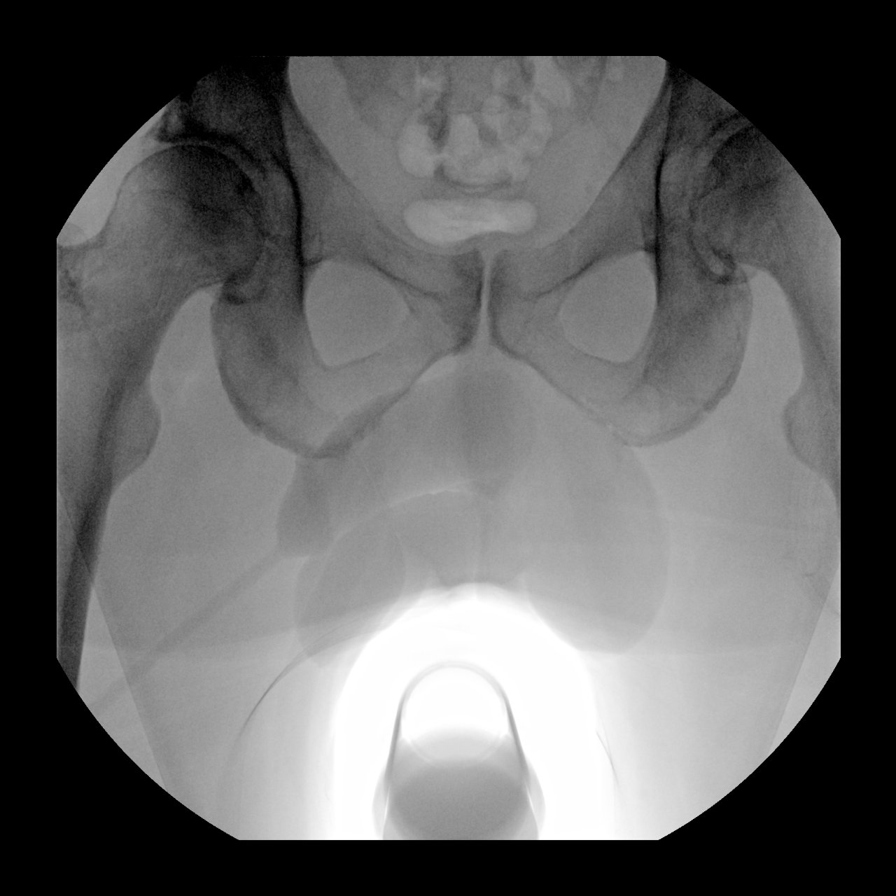
[im 3/6]
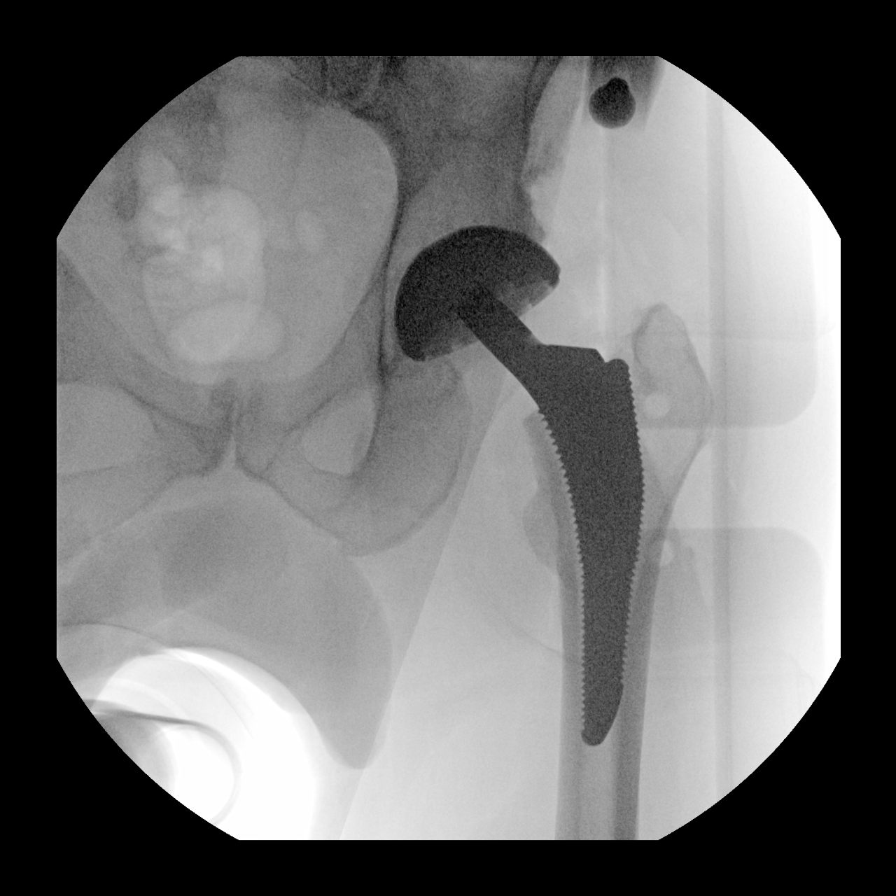
[im 4/6]
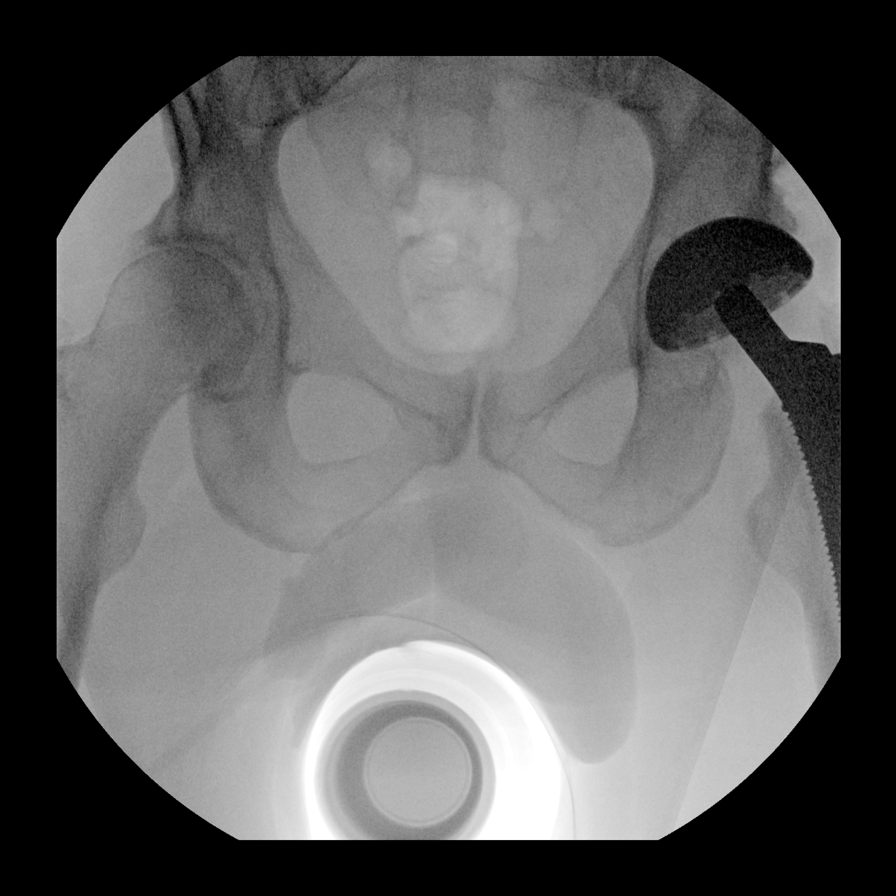
[im 5/6]
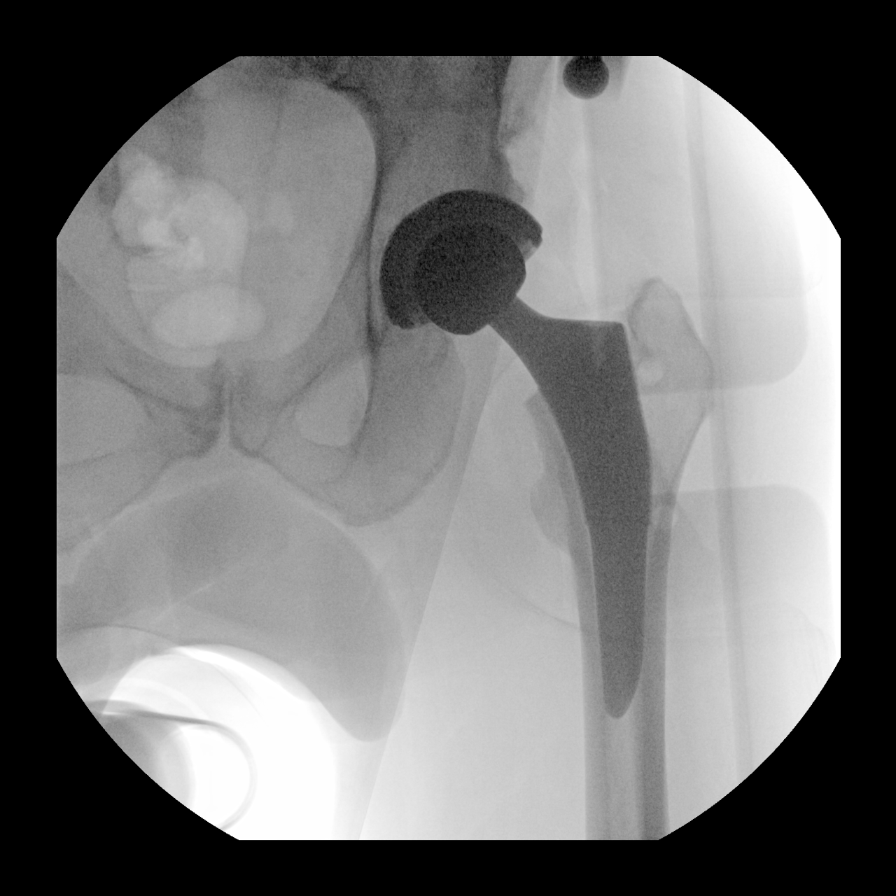
[im 6/6]
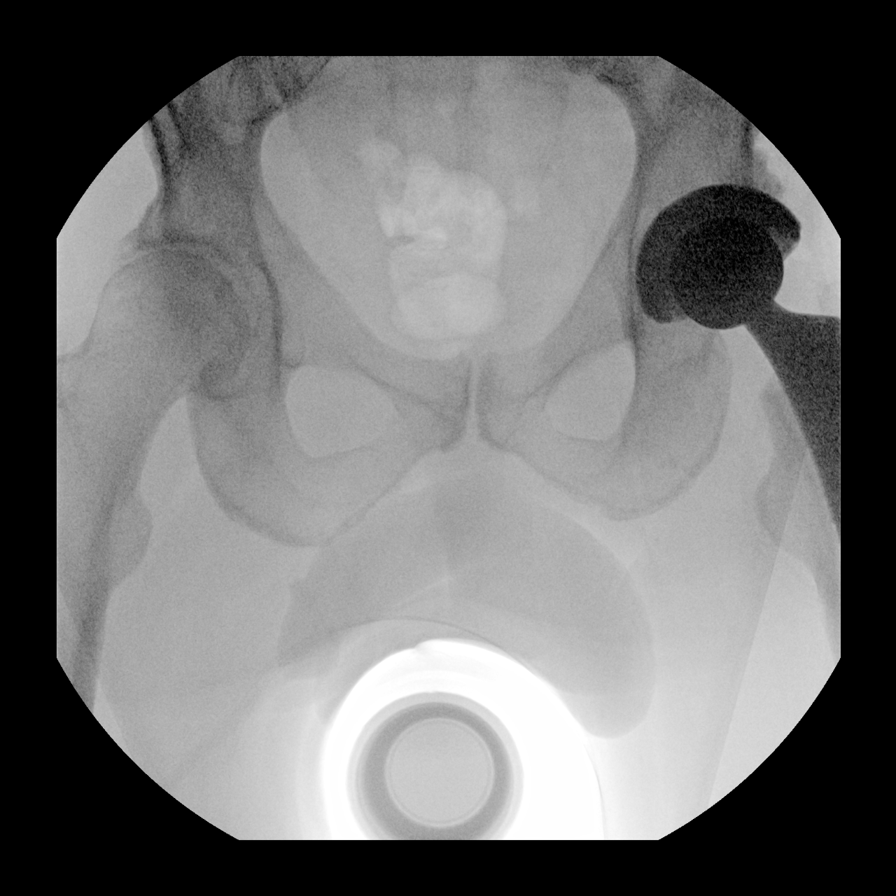

[6 of 6 positions shown; findings below may reference images not displayed]

FINDINGS: Intraoperative spot views demonstrate interval LEFT total hip
arthroplasty. No gross complicating features noted.
IMPRESSION: LEFT total hip arthroplasty.  No gross complicating features.

## 2020-03-29 DIAGNOSIS — D225 Melanocytic nevi of trunk: Secondary | ICD-10-CM | POA: Diagnosis not present

## 2020-03-29 DIAGNOSIS — D485 Neoplasm of uncertain behavior of skin: Secondary | ICD-10-CM | POA: Diagnosis not present

## 2020-03-29 DIAGNOSIS — D044 Carcinoma in situ of skin of scalp and neck: Secondary | ICD-10-CM | POA: Diagnosis not present

## 2020-03-29 DIAGNOSIS — Z85828 Personal history of other malignant neoplasm of skin: Secondary | ICD-10-CM | POA: Diagnosis not present

## 2020-03-29 DIAGNOSIS — L570 Actinic keratosis: Secondary | ICD-10-CM | POA: Diagnosis not present

## 2020-03-29 DIAGNOSIS — L905 Scar conditions and fibrosis of skin: Secondary | ICD-10-CM | POA: Diagnosis not present

## 2020-03-29 DIAGNOSIS — L821 Other seborrheic keratosis: Secondary | ICD-10-CM | POA: Diagnosis not present

## 2020-04-13 DIAGNOSIS — D044 Carcinoma in situ of skin of scalp and neck: Secondary | ICD-10-CM | POA: Diagnosis not present

## 2020-06-13 DIAGNOSIS — D044 Carcinoma in situ of skin of scalp and neck: Secondary | ICD-10-CM | POA: Diagnosis not present

## 2021-03-28 DIAGNOSIS — Z85828 Personal history of other malignant neoplasm of skin: Secondary | ICD-10-CM | POA: Diagnosis not present

## 2021-03-28 DIAGNOSIS — L814 Other melanin hyperpigmentation: Secondary | ICD-10-CM | POA: Diagnosis not present

## 2021-03-28 DIAGNOSIS — L821 Other seborrheic keratosis: Secondary | ICD-10-CM | POA: Diagnosis not present

## 2021-03-28 DIAGNOSIS — L905 Scar conditions and fibrosis of skin: Secondary | ICD-10-CM | POA: Diagnosis not present

## 2021-07-01 ENCOUNTER — Ambulatory Visit (INDEPENDENT_AMBULATORY_CARE_PROVIDER_SITE_OTHER): Payer: PPO

## 2021-07-01 ENCOUNTER — Ambulatory Visit (INDEPENDENT_AMBULATORY_CARE_PROVIDER_SITE_OTHER): Payer: PPO | Admitting: Family Medicine

## 2021-07-01 ENCOUNTER — Encounter: Payer: Self-pay | Admitting: Family Medicine

## 2021-07-01 ENCOUNTER — Other Ambulatory Visit: Payer: Self-pay

## 2021-07-01 VITALS — BP 154/78 | HR 66 | Temp 97.1°F | Ht 71.0 in | Wt 185.8 lb

## 2021-07-01 DIAGNOSIS — Z23 Encounter for immunization: Secondary | ICD-10-CM

## 2021-07-01 DIAGNOSIS — Z1211 Encounter for screening for malignant neoplasm of colon: Secondary | ICD-10-CM

## 2021-07-01 DIAGNOSIS — M50122 Cervical disc disorder at C5-C6 level with radiculopathy: Secondary | ICD-10-CM | POA: Diagnosis not present

## 2021-07-01 DIAGNOSIS — M50121 Cervical disc disorder at C4-C5 level with radiculopathy: Secondary | ICD-10-CM | POA: Diagnosis not present

## 2021-07-01 DIAGNOSIS — R030 Elevated blood-pressure reading, without diagnosis of hypertension: Secondary | ICD-10-CM

## 2021-07-01 DIAGNOSIS — M50123 Cervical disc disorder at C6-C7 level with radiculopathy: Secondary | ICD-10-CM | POA: Diagnosis not present

## 2021-07-01 DIAGNOSIS — M4312 Spondylolisthesis, cervical region: Secondary | ICD-10-CM | POA: Diagnosis not present

## 2021-07-01 DIAGNOSIS — M5412 Radiculopathy, cervical region: Secondary | ICD-10-CM | POA: Diagnosis not present

## 2021-07-01 MED ORDER — METHOCARBAMOL 500 MG PO TABS: 500.0000 mg | ORAL_TABLET | Freq: Three times a day (TID) | ORAL | 0 refills | Status: DC | PRN

## 2021-07-01 MED ORDER — MELOXICAM 15 MG PO TABS: ORAL_TABLET | ORAL | 0 refills | Status: DC

## 2021-07-01 NOTE — Progress Notes (Signed)
Established Patient Office Visit  Subjective:  Patient ID: Kerry Bright, male    DOB: 1950/10/11  Age: 70 y.o. MRN: [id number]  CC:  Chief Complaint  Patient presents with   Pain    C/O neck and back pains that come and go.     HPI [name] presents for evaluation and treatment of a 2-week history of neck with some left upper back pain.  Started after he had been working out in his yard picking up sticks.  He denies weakness numbness or tingling.  He has a stinging sensation in the left side of his neck that is sometimes felt in his left upper back.  No traumatic injury.  No history of hypertension.  He is due for follow-up colonoscopy.  Past Medical History:  Diagnosis Date   Arthritis    oa   History of kidney stones     Past Surgical History:  Procedure Laterality Date   basal areas removed     ear top of head face   right foot lesion drained  2000   TONSILLECTOMY     TOTAL HIP ARTHROPLASTY Left 05/11/2019   Procedure: TOTAL HIP ARTHROPLASTY ANTERIOR APPROACH;  Surgeon: Rod Can, MD;  Location: [facility];  Service: Orthopedics;  Laterality: Left;    Family History  Problem Relation Age of Onset   Cancer Father    Cancer Daughter     Social History   Socioeconomic History   Marital status: Married    Spouse name: Not on file   Number of children: Not on file   Years of education: Not on file   Highest education level: Not on file  Occupational History   Not on file  Tobacco Use   Smoking status: Former    Packs/day: 1.50    Years: 6.00    Pack years: 9.00    Types: Cigarettes   Smokeless tobacco: Former   Tobacco comments:    quit 1980  Vaping Use   Vaping Use: Never used  Substance and Sexual Activity   Alcohol use: Yes    Comment: 2-3 beers per day   Drug use: Never   Sexual activity: Not on file  Other Topics Concern   Not on file  Social History Narrative   Not on file   Social Determinants of Health   Financial Resource Strain:  Not on file  Food Insecurity: Not on file  Transportation Needs: Not on file  Physical Activity: Not on file  Stress: Not on file  Social Connections: Not on file  Intimate Partner Violence: Not on file    Outpatient Medications Prior to Visit  Medication Sig Dispense Refill   aspirin 81 MG chewable tablet Chew 1 tablet (81 mg total) by mouth 2 (two) times daily. 60 tablet 1   docusate sodium (COLACE) 100 MG capsule Take 1 capsule (100 mg total) by mouth 2 (two) times daily. 60 capsule 1   HYDROcodone-acetaminophen (NORCO/VICODIN) 5-325 MG tablet Take 1 tablet by mouth every 4 (four) hours as needed for moderate pain (pain score 4-6). 30 tablet 0   ondansetron (ZOFRAN) 4 MG tablet Take 1 tablet (4 mg total) by mouth every 6 (six) hours as needed for nausea. 20 tablet 0   senna (SENOKOT) 8.6 MG TABS tablet Take 1 tablet (8.6 mg total) by mouth 2 (two) times daily. 120 tablet 0   No facility-administered medications prior to visit.    No Known Allergies  ROS Review of Systems  Constitutional:  Negative.   HENT: Negative.    Eyes:  Negative for photophobia and visual disturbance.  Respiratory: Negative.    Cardiovascular: Negative.   Gastrointestinal: Negative.   Endocrine: Negative for polyphagia and polyuria.  Musculoskeletal:  Positive for neck pain and neck stiffness.  Neurological:  Negative for weakness, numbness and headaches.     Objective:    Physical Exam Vitals and nursing note reviewed.  Constitutional:      General: He is not in acute distress.    Appearance: Normal appearance. He is not ill-appearing, toxic-appearing or diaphoretic.  HENT:     Head: Normocephalic and atraumatic.     Right Ear: External ear normal.     Left Ear: External ear normal.  Eyes:     General: No scleral icterus.       Right eye: No discharge.        Left eye: No discharge.     Extraocular Movements: Extraocular movements intact.     Conjunctiva/sclera: Conjunctivae normal.   Pulmonary:     Effort: Pulmonary effort is normal.  Musculoskeletal:     Right shoulder: Normal. No tenderness or bony tenderness. Normal range of motion.     Left shoulder: Normal. No tenderness or bony tenderness. Normal range of motion.     Cervical back: Spasms present. No tenderness or bony tenderness. No pain with movement. Decreased range of motion.       Back:  Skin:    General: Skin is warm and dry.  Neurological:     Mental Status: He is alert and oriented to person, place, and time.     Motor: No weakness.     Deep Tendon Reflexes:     Reflex Scores:      Tricep reflexes are 2+ on the right side and 2+ on the left side.      Bicep reflexes are 1+ on the right side and 1+ on the left side.      Brachioradialis reflexes are 2+ on the right side and 2+ on the left side.    Comments: Spurling's test to right and left to cause pain in the left posterior neck.  Psychiatric:        Mood and Affect: Mood normal.        Behavior: Behavior normal.    BP (!) 154/78 (BP Location: Right Arm, Patient Position: Sitting, Cuff Size: Normal)   Pulse 66   Temp (!) 97.1 F (36.2 C) (Temporal)   Ht 5\' 11"  (1.803 m)   Wt 185 lb 12.8 oz (84.3 kg)   SpO2 97%   BMI 25.91 kg/m  Wt Readings from Last 3 Encounters:  07/01/21 185 lb 12.8 oz (84.3 kg)  05/11/19 181 lb 15.8 oz (82.6 kg)  05/04/19 182 lb (82.6 kg)     Health Maintenance Due  Topic Date Due   TETANUS/TDAP  Never done   COLONOSCOPY (Pts 45-17yrs Insurance coverage will need to be confirmed)  Never done   Zoster Vaccines- Shingrix (1 of 2) Never done   Pneumonia Vaccine 64+ Years old (2 - PCV) 09/21/2018    There are no preventive care reminders to display for this patient.  Lab Results  Component Value Date   TSH 2.10 09/22/2017   Lab Results  Component Value Date   WBC 11.1 (H) 05/12/2019   HGB 11.8 (L) 05/12/2019   HCT 36.2 (L) 05/12/2019   MCV 91.4 05/12/2019   PLT 195 05/12/2019   Lab Results  Component  Value  Date   NA 138 05/12/2019   K 3.9 05/12/2019   CO2 22 05/12/2019   GLUCOSE 128 (H) 05/12/2019   BUN 16 05/12/2019   CREATININE 0.60 (L) 05/12/2019   BILITOT 0.8 05/04/2019   ALKPHOS 46 05/04/2019   AST 21 05/04/2019   ALT 19 05/04/2019   PROT 6.7 05/04/2019   ALBUMIN 3.9 05/04/2019   CALCIUM 8.3 (L) 05/12/2019   ANIONGAP 7 05/12/2019   GFR 111.96 03/04/2019   Lab Results  Component Value Date   CHOL 202 (H) 03/04/2019   Lab Results  Component Value Date   HDL 81.00 03/04/2019   Lab Results  Component Value Date   LDLCALC 113 (H) 03/04/2019   Lab Results  Component Value Date   TRIG 38.0 03/04/2019   Lab Results  Component Value Date   CHOLHDL 2 03/04/2019   No results found for: HGBA1C    Assessment & Plan:   Problem List Items Addressed This Visit       Nervous and Auditory   Cervical radiculopathy   Relevant Medications   meloxicam (MOBIC) 15 MG tablet   methocarbamol (ROBAXIN) 500 MG tablet   Other Relevant Orders   DG Cervical Spine Complete     Other   Need for influenza vaccination - Primary   Relevant Orders   Flu Vaccine QUAD High Dose(Fluad) (Completed)   Screen for colon cancer   Relevant Orders   Ambulatory referral to Gastroenterology   Elevated BP without diagnosis of hypertension    Meds ordered this encounter  Medications   meloxicam (MOBIC) 15 MG tablet    Sig: Take one daily for 2 weeks and then as needed.    Dispense:  30 tablet    Refill:  0   methocarbamol (ROBAXIN) 500 MG tablet    Sig: Take 1 tablet (500 mg total) by mouth every 8 (eight) hours as needed for muscle spasms.    Dispense:  50 tablet    Refill:  0     Follow-up: Return return soon for physical and follow up of blood pressure..  Cervical strain with possible radiculopathy.  We will check x-rays.  Given information on cervical radiculopathy exercises.  Mobic 15 mg daily for 2 weeks and then as needed.  Robaxin as needed.  Information given on preventing  hypertension information given on screening for colon cancer he is due.  He will return soon for a physical exam and follow-up of his blood pressure and cervical strain.  Libby Maw, MD

## 2021-09-10 ENCOUNTER — Other Ambulatory Visit: Payer: Self-pay

## 2021-09-10 ENCOUNTER — Ambulatory Visit (INDEPENDENT_AMBULATORY_CARE_PROVIDER_SITE_OTHER): Payer: PPO | Admitting: Family Medicine

## 2021-09-10 ENCOUNTER — Encounter: Payer: Self-pay | Admitting: Family Medicine

## 2021-09-10 VITALS — BP 158/70 | HR 76 | Temp 97.2°F | Ht 71.0 in | Wt 184.2 lb

## 2021-09-10 DIAGNOSIS — Z0000 Encounter for general adult medical examination without abnormal findings: Secondary | ICD-10-CM | POA: Diagnosis not present

## 2021-09-10 DIAGNOSIS — I10 Essential (primary) hypertension: Secondary | ICD-10-CM | POA: Diagnosis not present

## 2021-09-10 DIAGNOSIS — Z1211 Encounter for screening for malignant neoplasm of colon: Secondary | ICD-10-CM

## 2021-09-10 MED ORDER — AMLODIPINE BESYLATE 5 MG PO TABS: 5.0000 mg | ORAL_TABLET | Freq: Every day | ORAL | 1 refills | Status: DC

## 2021-09-10 NOTE — Progress Notes (Signed)
Established Patient Office Visit  Subjective:  Patient ID: Kerry Bright, male    DOB: 1951/04/18  Age: 71 y.o. MRN: [id number]  CC:  Chief Complaint  Patient presents with   Annual Exam    CPE, no concerns. Patient not fasting.     HPI Kerry Bright presents for physical exam and follow-up of his blood pressure.  Blood pressure has been elevated in the past.  Denies headaches blurred vision or bloody nose.  Planning on retirement at the end of this year.  Denies any difficulty with urination or changes in his stooling pattern.  Denies hematochezia or blood in his stool.  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  Past Medical History:  Diagnosis Date   Arthritis    oa   History of kidney stones     Past Surgical History:  Procedure Laterality Date   basal areas removed     ear top of head face   right foot lesion drained  2000   TONSILLECTOMY     TOTAL HIP ARTHROPLASTY Left 05/11/2019   Procedure: TOTAL HIP ARTHROPLASTY ANTERIOR APPROACH;  Surgeon: Rod Can, MD;  Location: [facility];  Service: Orthopedics;  Laterality: Left;    Family History  Problem Relation Age of Onset   Cancer Father    Cancer Daughter     Social History   Socioeconomic History   Marital status: Married    Spouse name: Not on file   Number of children: Not on file   Years of education: Not on file   Highest education level: Not on file  Occupational History   Not on file  Tobacco Use   Smoking status: Former    Packs/day: 1.50    Years: 6.00    Pack years: 9.00    Types: Cigarettes   Smokeless tobacco: Former   Tobacco comments:    quit 1980  Vaping Use   Vaping Use: Never used  Substance and Sexual Activity   Alcohol use: Yes    Comment: 2-3 beers per day   Drug use: Never   Sexual activity: Not Currently  Other Topics Concern   Not on file  Social History Narrative   Not on file   Social Determinants of Health   Financial Resource Strain: Not on file  Food  Insecurity: Not on file  Transportation Needs: Not on file  Physical Activity: Not on file  Stress: Not on file  Social Connections: Not on file  Intimate Partner Violence: Not on file    Outpatient Medications Prior to Visit  Medication Sig Dispense Refill   meloxicam (MOBIC) 15 MG tablet Take one daily for 2 weeks and then as needed. (Patient not taking: Reported on 09/10/2021) 30 tablet 0   methocarbamol (ROBAXIN) 500 MG tablet Take 1 tablet (500 mg total) by mouth every 8 (eight) hours as needed for muscle spasms. (Patient not taking: Reported on 09/10/2021) 50 tablet 0   No facility-administered medications prior to visit.    No Known Allergies  ROS Review of Systems  Constitutional:  Negative for chills, diaphoresis, fatigue, fever and unexpected weight change.  HENT: Negative.    Eyes:  Negative for photophobia and visual disturbance.  Respiratory: Negative.    Cardiovascular: Negative.   Gastrointestinal: Negative.  Negative for anal bleeding, blood in stool and constipation.  Endocrine: Negative for polyphagia and polyuria.  Genitourinary:  Negative for difficulty urinating, frequency, hematuria and urgency.  Musculoskeletal:  Negative for gait problem and joint  swelling.  Skin:  Negative for pallor and rash.  Neurological:  Negative for speech difficulty and weakness.  Hematological:  Does not bruise/bleed easily.  Psychiatric/Behavioral: Negative.       Objective:    Physical Exam Vitals and nursing note reviewed.  Constitutional:      General: He is not in acute distress.    Appearance: Normal appearance. He is not ill-appearing, toxic-appearing or diaphoretic.  HENT:     Head: Normocephalic and atraumatic.     Right Ear: Tympanic membrane, ear canal and external ear normal.     Left Ear: Tympanic membrane, ear canal and external ear normal.     Mouth/Throat:     Mouth: Mucous membranes are moist.     Pharynx: Oropharynx is clear. No oropharyngeal exudate or  posterior oropharyngeal erythema.  Eyes:     General:        Right eye: No discharge.        Left eye: No discharge.     Conjunctiva/sclera: Conjunctivae normal.     Pupils: Pupils are equal, round, and reactive to light.  Neck:     Vascular: No carotid bruit.  Cardiovascular:     Rate and Rhythm: Normal rate and regular rhythm.  Pulmonary:     Effort: Pulmonary effort is normal.     Breath sounds: Normal breath sounds.  Abdominal:     General: Bowel sounds are normal.  Genitourinary:    Prostate: Not enlarged, not tender and no nodules present.     Rectum: Guaiac result negative. No mass, tenderness, anal fissure, external hemorrhoid or internal hemorrhoid. Normal anal tone.  Musculoskeletal:     Cervical back: No rigidity or tenderness.  Lymphadenopathy:     Cervical: No cervical adenopathy.  Skin:    General: Skin is warm and dry.  Neurological:     Mental Status: He is alert and oriented to person, place, and time.  Psychiatric:        Mood and Affect: Mood normal.        Behavior: Behavior normal.    BP (!) 158/70 (BP Location: Left Arm, Patient Position: Sitting, Cuff Size: Normal)    Pulse 76    Temp (!) 97.2 F (36.2 C) (Temporal)    Ht 5\' 11"  (1.803 m)    Wt 184 lb 3.2 oz (83.6 kg)    SpO2 94%    BMI 25.69 kg/m  Wt Readings from Last 3 Encounters:  09/10/21 184 lb 3.2 oz (83.6 kg)  07/01/21 185 lb 12.8 oz (84.3 kg)  05/11/19 181 lb 15.8 oz (82.6 kg)     Health Maintenance Due  Topic Date Due   TETANUS/TDAP  Never done   COLONOSCOPY (Pts 45-56yrs Insurance coverage will need to be confirmed)  Never done   Zoster Vaccines- Shingrix (1 of 2) Never done   Pneumonia Vaccine 84+ Years old (2 - PCV) 09/21/2018    There are no preventive care reminders to display for this patient.  Lab Results  Component Value Date   TSH 2.10 09/22/2017   Lab Results  Component Value Date   WBC 11.1 (H) 05/12/2019   HGB 11.8 (L) 05/12/2019   HCT 36.2 (L) 05/12/2019   MCV  91.4 05/12/2019   PLT 195 05/12/2019   Lab Results  Component Value Date   NA 138 05/12/2019   K 3.9 05/12/2019   CO2 22 05/12/2019   GLUCOSE 128 (H) 05/12/2019   BUN 16 05/12/2019   CREATININE 0.60 (L)  05/12/2019   BILITOT 0.8 05/04/2019   ALKPHOS 46 05/04/2019   AST 21 05/04/2019   ALT 19 05/04/2019   PROT 6.7 05/04/2019   ALBUMIN 3.9 05/04/2019   CALCIUM 8.3 (L) 05/12/2019   ANIONGAP 7 05/12/2019   GFR 111.96 03/04/2019   Lab Results  Component Value Date   CHOL 202 (H) 03/04/2019   Lab Results  Component Value Date   HDL 81.00 03/04/2019   Lab Results  Component Value Date   LDLCALC 113 (H) 03/04/2019   Lab Results  Component Value Date   TRIG 38.0 03/04/2019   Lab Results  Component Value Date   CHOLHDL 2 03/04/2019   No results found for: HGBA1C    Assessment & Plan:   Problem List Items Addressed This Visit       Other   Screen for colon cancer   Relevant Orders   Ambulatory referral to Gastroenterology   Other Visit Diagnoses     Essential hypertension    -  Primary   Relevant Medications   amLODipine (NORVASC) 5 MG tablet   Other Relevant Orders   Comprehensive metabolic panel   CBC   Healthcare maintenance       Relevant Orders   Urinalysis, Routine w reflex microscopic   PSA   Lipid panel   LDL cholesterol, direct       Meds ordered this encounter  Medications   amLODipine (NORVASC) 5 MG tablet    Sig: Take 1 tablet (5 mg total) by mouth daily.    Dispense:  90 tablet    Refill:  1    Follow-up: Return in about 3 months (around 12/09/2021).  Information was given on health maintenance and disease prevention.  Agrees to start amlodipine for hypertension.  Also was given information on managing hypertension and amlodipine.  Am hopeful that his blood pressure will naturally decline after he fully retires.  Libby Maw, MD

## 2021-09-16 ENCOUNTER — Other Ambulatory Visit (INDEPENDENT_AMBULATORY_CARE_PROVIDER_SITE_OTHER): Payer: PPO

## 2021-09-16 ENCOUNTER — Other Ambulatory Visit: Payer: Self-pay

## 2021-09-16 DIAGNOSIS — I10 Essential (primary) hypertension: Secondary | ICD-10-CM

## 2021-09-16 DIAGNOSIS — Z0000 Encounter for general adult medical examination without abnormal findings: Secondary | ICD-10-CM | POA: Diagnosis not present

## 2021-09-16 DIAGNOSIS — Z125 Encounter for screening for malignant neoplasm of prostate: Secondary | ICD-10-CM | POA: Diagnosis not present

## 2021-09-16 LAB — CBC
HCT: 44.2 % (ref 39.0–52.0)
Hemoglobin: 14.7 g/dL (ref 13.0–17.0)
MCHC: 33.2 g/dL (ref 30.0–36.0)
MCV: 87.3 fl (ref 78.0–100.0)
Platelets: 231 10*3/uL (ref 150.0–400.0)
RBC: 5.07 Mil/uL (ref 4.22–5.81)
RDW: 14.3 % (ref 11.5–15.5)
WBC: 5.5 10*3/uL (ref 4.0–10.5)

## 2021-09-16 LAB — URINALYSIS, ROUTINE W REFLEX MICROSCOPIC
Bilirubin Urine: NEGATIVE
Ketones, ur: NEGATIVE
Leukocytes,Ua: NEGATIVE
Nitrite: NEGATIVE
Specific Gravity, Urine: 1.02 (ref 1.000–1.030)
Total Protein, Urine: NEGATIVE
Urine Glucose: NEGATIVE
Urobilinogen, UA: 0.2 (ref 0.0–1.0)
WBC, UA: NONE SEEN (ref 0–?)
pH: 6 (ref 5.0–8.0)

## 2021-09-16 LAB — PSA: PSA: 1.69 ng/mL (ref 0.10–4.00)

## 2021-09-17 LAB — COMPREHENSIVE METABOLIC PANEL
ALT: 15 U/L (ref 0–53)
AST: 18 U/L (ref 0–37)
Albumin: 4.4 g/dL (ref 3.5–5.2)
Alkaline Phosphatase: 50 U/L (ref 39–117)
BUN: 19 mg/dL (ref 6–23)
CO2: 23 mEq/L (ref 19–32)
Calcium: 9.4 mg/dL (ref 8.4–10.5)
Chloride: 105 mEq/L (ref 96–112)
Creatinine, Ser: 0.91 mg/dL (ref 0.40–1.50)
GFR: 85.03 mL/min (ref >60.00)
Glucose, Bld: 100 mg/dL — ABNORMAL HIGH (ref 70–99)
Potassium: 4.2 mEq/L (ref 3.5–5.1)
Sodium: 138 mEq/L (ref 135–145)
Total Bilirubin: 0.6 mg/dL (ref 0.2–1.2)
Total Protein: 6.5 g/dL (ref 6.0–8.3)

## 2021-09-17 LAB — LIPID PANEL
Cholesterol: 208 mg/dL — ABNORMAL HIGH (ref 0–200)
HDL: 76.6 mg/dL (ref >39.00)
LDL Cholesterol: 118 mg/dL — ABNORMAL HIGH (ref 0–99)
NonHDL: 131.56
Total CHOL/HDL Ratio: 3
Triglycerides: 70 mg/dL (ref 0.0–149.0)
VLDL: 14 mg/dL (ref 0.0–40.0)

## 2021-09-17 LAB — LDL CHOLESTEROL, DIRECT: Direct LDL: 115 mg/dL

## 2021-10-03 ENCOUNTER — Ambulatory Visit (INDEPENDENT_AMBULATORY_CARE_PROVIDER_SITE_OTHER): Payer: PPO | Admitting: Family Medicine

## 2021-10-03 DIAGNOSIS — E7800 Pure hypercholesterolemia, unspecified: Secondary | ICD-10-CM | POA: Insufficient documentation

## 2021-10-03 DIAGNOSIS — Z789 Other specified health status: Secondary | ICD-10-CM

## 2021-10-03 MED ORDER — ATORVASTATIN CALCIUM 10 MG PO TABS: 10.0000 mg | ORAL_TABLET | Freq: Every day | ORAL | 3 refills | Status: DC

## 2021-10-03 NOTE — Progress Notes (Signed)
Established Patient Office Visit  Subjective:  Patient ID: Kerry Bright, male    DOB: 22-May-1951  Age: 71 y.o. MRN: [id number]  CC:  Chief Complaint  Patient presents with   Advice Only    Discuss labs    HPI [name] presents for for follow-up discussion of his labs.  Labs were actually pretty good.  Favorable HDL level with a minimally elevated LDL.  Due to his age and treatment for hypertension his ASCVD risk score is elevated at 26%.  We discussed using a statin.  He also wanted to discuss his alcohol usage.  He typically drinks about 4 beers nightly.  He has done this for years.  Rarely if ever drinks more than that.  Denies morning shakes.  Significant other has not been concerned.  He does not avoid social engagements that do not involve alcohol.  He has cut back in the past but then usually goes back to his usual 4 beers daily.  Past Medical History:  Diagnosis Date   Arthritis    oa   History of kidney stones     Past Surgical History:  Procedure Laterality Date   basal areas removed     ear top of head face   right foot lesion drained  2000   TONSILLECTOMY     TOTAL HIP ARTHROPLASTY Left 05/11/2019   Procedure: TOTAL HIP ARTHROPLASTY ANTERIOR APPROACH;  Surgeon: Rod Can, MD;  Location: [facility];  Service: Orthopedics;  Laterality: Left;    Family History  Problem Relation Age of Onset   Cancer Father    Cancer Daughter     Social History   Socioeconomic History   Marital status: Married    Spouse name: Not on file   Number of children: Not on file   Years of education: Not on file   Highest education level: Bachelor's degree (e.g., BA, AB, BS)  Occupational History   Not on file  Tobacco Use   Smoking status: Former    Packs/day: 1.50    Years: 6.00    Pack years: 9.00    Types: Cigarettes   Smokeless tobacco: Former   Tobacco comments:    quit 1980  Vaping Use   Vaping Use: Never used  Substance and Sexual Activity   Alcohol use: Yes     Comment: 2-3 beers per day   Drug use: Never   Sexual activity: Not Currently  Other Topics Concern   Not on file  Social History Narrative   Not on file   Social Determinants of Health   Financial Resource Strain: Low Risk    Difficulty of Paying Living Expenses: Not hard at all  Food Insecurity: No Food Insecurity   Worried About Charity fundraiser in the Last Year: Never true   Ran Out of Food in the Last Year: Never true  Transportation Needs: No Transportation Needs   Lack of Transportation (Medical): No   Lack of Transportation (Non-Medical): No  Physical Activity: Insufficiently Active   Days of Exercise per Week: 2 days   Minutes of Exercise per Session: 60 min  Stress: No Stress Concern Present   Feeling of Stress : Only a little  Social Connections: Unknown   Frequency of Communication with Friends and Family: Twice a week   Frequency of Social Gatherings with Friends and Family: Patient refused   Attends Religious Services: 1 to 4 times per year   Active Member of Clubs or Organizations: No   Attends  Music therapist: Not on file   Marital Status: Married  Human resources officer Violence: Not on file    Outpatient Medications Prior to Visit  Medication Sig Dispense Refill   amLODipine (NORVASC) 5 MG tablet Take 1 tablet (5 mg total) by mouth daily. 90 tablet 1   No facility-administered medications prior to visit.    No Known Allergies  ROS Review of Systems  Constitutional:  Negative for diaphoresis, fatigue, fever and unexpected weight change.  HENT: Negative.    Eyes:  Negative for photophobia and visual disturbance.  Respiratory: Negative.    Cardiovascular: Negative.   Gastrointestinal: Negative.   Genitourinary: Negative.   Neurological: Negative.      Objective:    Physical Exam Vitals and nursing note reviewed.  Constitutional:      General: He is not in acute distress.    Appearance: Normal appearance. He is not  ill-appearing, toxic-appearing or diaphoretic.  HENT:     Head: Normocephalic and atraumatic.     Right Ear: External ear normal.     Left Ear: External ear normal.  Eyes:     General: No scleral icterus.       Right eye: No discharge.        Left eye: No discharge.     Conjunctiva/sclera: Conjunctivae normal.  Pulmonary:     Effort: Pulmonary effort is normal.  Neurological:     Mental Status: He is alert and oriented to person, place, and time.  Psychiatric:        Mood and Affect: Mood normal.        Behavior: Behavior normal.    There were no vitals taken for this visit. Wt Readings from Last 3 Encounters:  09/10/21 184 lb 3.2 oz (83.6 kg)  07/01/21 185 lb 12.8 oz (84.3 kg)  05/11/19 181 lb 15.8 oz (82.6 kg)     Health Maintenance Due  Topic Date Due   TETANUS/TDAP  Never done   COLONOSCOPY (Pts 45-76yrs Insurance coverage will need to be confirmed)  Never done   Zoster Vaccines- Shingrix (1 of 2) Never done   Pneumonia Vaccine 23+ Years old (2 - PCV) 09/21/2018    There are no preventive care reminders to display for this patient.  Lab Results  Component Value Date   TSH 2.10 09/22/2017   Lab Results  Component Value Date   WBC 5.5 09/16/2021   HGB 14.7 09/16/2021   HCT 44.2 09/16/2021   MCV 87.3 09/16/2021   PLT 231.0 09/16/2021   Lab Results  Component Value Date   NA 138 09/16/2021   K 4.2 09/16/2021   CO2 23 09/16/2021   GLUCOSE 100 (H) 09/16/2021   BUN 19 09/16/2021   CREATININE 0.91 09/16/2021   BILITOT 0.6 09/16/2021   ALKPHOS 50 09/16/2021   AST 18 09/16/2021   ALT 15 09/16/2021   PROT 6.5 09/16/2021   ALBUMIN 4.4 09/16/2021   CALCIUM 9.4 09/16/2021   ANIONGAP 7 05/12/2019   GFR 85.03 09/16/2021   Lab Results  Component Value Date   CHOL 208 (H) 09/16/2021   Lab Results  Component Value Date   HDL 76.60 09/16/2021   Lab Results  Component Value Date   LDLCALC 118 (H) 09/16/2021   Lab Results  Component Value Date   TRIG  70.0 09/16/2021   Lab Results  Component Value Date   CHOLHDL 3 09/16/2021   No results found for: HGBA1C    Assessment & Plan:   Problem  List Items Addressed This Visit       Other   Elevated LDL cholesterol level - Primary   Relevant Medications   atorvastatin (LIPITOR) 10 MG tablet   Alcohol use    Meds ordered this encounter  Medications   atorvastatin (LIPITOR) 10 MG tablet    Sig: Take 1 tablet (10 mg total) by mouth daily.    Dispense:  90 tablet    Refill:  3    Follow-up: Return has fu appointment in April.Rozetta Nunnery to start a statin.  Discussed that current recommendations for normal drinking would be no more than 2 servings of alcohol in a given 24-hour period.  He understands that the alcohol could be affecting his blood pressure.  It may also be playing a part in his elevated HDL.  Do not believe that inpatient treatment for alcohol use disorder is indicated.  He understands that there are outpatient treatment programs available.  We will continue to follow.  Asked about a colonoscopy.  That was ordered at last visit.    Virtual Visit via Video Note  I connected with [name] on 10/03/21 at  3:30 PM EST by a video enabled telemedicine application and verified that I am speaking with the correct person using two identifiers.  Location: Patient: home with wife. Provider: work   I discussed the limitations of evaluation and management by telemedicine and the availability of in person appointments. The patient expressed understanding and agreed to proceed.  History of Present Illness:    Observations/Objective:   Assessment and Plan:   Follow Up Instructions:    I discussed the assessment and treatment plan with the patient. The patient was provided an opportunity to ask questions and all were answered. The patient agreed with the plan and demonstrated an understanding of the instructions.   The patient was advised to call back or seek an  in-person evaluation if the symptoms worsen or if the condition fails to improve as anticipated.  I provided 31 minutes of non-face-to-face time during this encounter.   Libby Maw, MD

## 2021-12-04 ENCOUNTER — Encounter: Payer: Self-pay | Admitting: Gastroenterology

## 2021-12-09 ENCOUNTER — Encounter: Payer: Self-pay | Admitting: Family Medicine

## 2021-12-09 ENCOUNTER — Ambulatory Visit (INDEPENDENT_AMBULATORY_CARE_PROVIDER_SITE_OTHER): Payer: PPO | Admitting: Family Medicine

## 2021-12-09 VITALS — BP 126/72 | HR 77 | Temp 98.2°F | Ht 71.0 in | Wt 182.6 lb

## 2021-12-09 DIAGNOSIS — Z789 Other specified health status: Secondary | ICD-10-CM | POA: Diagnosis not present

## 2021-12-09 DIAGNOSIS — F1090 Alcohol use, unspecified, uncomplicated: Secondary | ICD-10-CM

## 2021-12-09 DIAGNOSIS — E7800 Pure hypercholesterolemia, unspecified: Secondary | ICD-10-CM | POA: Diagnosis not present

## 2021-12-09 DIAGNOSIS — I10 Essential (primary) hypertension: Secondary | ICD-10-CM | POA: Diagnosis not present

## 2021-12-09 DIAGNOSIS — N5201 Erectile dysfunction due to arterial insufficiency: Secondary | ICD-10-CM

## 2021-12-09 DIAGNOSIS — Z23 Encounter for immunization: Secondary | ICD-10-CM | POA: Diagnosis not present

## 2021-12-09 MED ORDER — SILDENAFIL CITRATE 20 MG PO TABS: ORAL_TABLET | ORAL | 1 refills | Status: DC

## 2021-12-09 MED ORDER — ATORVASTATIN CALCIUM 10 MG PO TABS: 10.0000 mg | ORAL_TABLET | Freq: Every day | ORAL | 3 refills | Status: DC

## 2021-12-09 MED ORDER — AMLODIPINE BESYLATE 5 MG PO TABS: 5.0000 mg | ORAL_TABLET | Freq: Every day | ORAL | 1 refills | Status: DC

## 2021-12-09 MED ORDER — SILDENAFIL CITRATE 20 MG PO TABS: ORAL_TABLET | ORAL | 1 refills | Status: AC

## 2021-12-09 NOTE — Progress Notes (Signed)
? ?Established Patient Office Visit ? ?Subjective:  ?Patient ID: Kerry Bright, male    DOB: 07-23-1951  Age: 71 y.o. MRN: [id number] ? ?CC:  ?Chief Complaint  ?Patient presents with  ? Follow-up  ?  3 minth follow up, no concerns. Patient not fasting.   ? ? ?HPI ?Kerry Bright presents for follow-up of hypertension, elevated ASCVD risk score and alcohol use.  Continues to work at moderating alcohol intake.  Blood pressure well controlled with amlodipine.  Having no issues taking atorvastatin. ? ?Past Medical History:  ?Diagnosis Date  ? Arthritis   ? oa  ? History of kidney stones   ? ? ?Past Surgical History:  ?Procedure Laterality Date  ? basal areas removed    ? ear top of head face  ? right foot lesion drained  2000  ? TONSILLECTOMY    ? TOTAL HIP ARTHROPLASTY Left 05/11/2019  ? Procedure: TOTAL HIP ARTHROPLASTY ANTERIOR APPROACH;  Surgeon: Rod Can, MD;  Location: [facility];  Service: Orthopedics;  Laterality: Left;  ? ? ?Family History  ?Problem Relation Age of Onset  ? Cancer Father   ? Cancer Daughter   ? ? ?Social History  ? ?Socioeconomic History  ? Marital status: Married  ?  Spouse name: Not on file  ? Number of children: Not on file  ? Years of education: Not on file  ? Highest education level: Bachelor's degree (e.g., BA, AB, BS)  ?Occupational History  ? Not on file  ?Tobacco Use  ? Smoking status: Former  ?  Packs/day: 1.50  ?  Years: 6.00  ?  Pack years: 9.00  ?  Types: Cigarettes  ? Smokeless tobacco: Former  ? Tobacco comments:  ?  quit 1980  ?Vaping Use  ? Vaping Use: Never used  ?Substance and Sexual Activity  ? Alcohol use: Yes  ?  Comment: 2-3 beers per day  ? Drug use: Never  ? Sexual activity: Not Currently  ?Other Topics Concern  ? Not on file  ?Social History Narrative  ? Not on file  ? ?Social Determinants of Health  ? ?Financial Resource Strain: Low Risk   ? Difficulty of Paying Living Expenses: Not hard at all  ?Food Insecurity: No Food Insecurity  ? Worried About Charity fundraiser  in the Last Year: Never true  ? Ran Out of Food in the Last Year: Never true  ?Transportation Needs: No Transportation Needs  ? Lack of Transportation (Medical): No  ? Lack of Transportation (Non-Medical): No  ?Physical Activity: Insufficiently Active  ? Days of Exercise per Week: 2 days  ? Minutes of Exercise per Session: 60 min  ?Stress: No Stress Concern Present  ? Feeling of Stress : Only a little  ?Social Connections: Unknown  ? Frequency of Communication with Friends and Family: Twice a week  ? Frequency of Social Gatherings with Friends and Family: Patient refused  ? Attends Religious Services: 1 to 4 times per year  ? Active Member of Clubs or Organizations: No  ? Attends Archivist Meetings: Not on file  ? Marital Status: Married  ?Intimate Partner Violence: Not on file  ? ? ?Outpatient Medications Prior to Visit  ?Medication Sig Dispense Refill  ? amLODipine (NORVASC) 5 MG tablet Take 1 tablet (5 mg total) by mouth daily. 90 tablet 1  ? atorvastatin (LIPITOR) 10 MG tablet Take 1 tablet (10 mg total) by mouth daily. 90 tablet 3  ? ?No facility-administered medications prior to visit.  ? ? ?  No Known Allergies ? ?ROS ?Review of Systems  ?Constitutional: Negative.   ?HENT: Negative.    ?Eyes:  Negative for photophobia and visual disturbance.  ?Respiratory: Negative.    ?Cardiovascular: Negative.   ?Gastrointestinal: Negative.   ?Endocrine: Negative for polyphagia and polyuria.  ?Genitourinary: Negative.   ?Musculoskeletal:  Negative for arthralgias and myalgias.  ?Psychiatric/Behavioral: Negative.    ? ?  ?Objective:  ?  ?Physical Exam ?Vitals and nursing note reviewed.  ?Constitutional:   ?   General: He is not in acute distress. ?   Appearance: Normal appearance. He is not ill-appearing, toxic-appearing or diaphoretic.  ?HENT:  ?   Head: Normocephalic and atraumatic.  ?   Right Ear: External ear normal.  ?   Left Ear: External ear normal.  ?   Mouth/Throat:  ?   Mouth: Mucous membranes are moist.   ?   Pharynx: Oropharynx is clear. No oropharyngeal exudate or posterior oropharyngeal erythema.  ?Eyes:  ?   General: No scleral icterus.    ?   Right eye: No discharge.     ?   Left eye: No discharge.  ?   Extraocular Movements: Extraocular movements intact.  ?   Conjunctiva/sclera: Conjunctivae normal.  ?Cardiovascular:  ?   Rate and Rhythm: Normal rate and regular rhythm.  ?Pulmonary:  ?   Effort: Pulmonary effort is normal.  ?   Breath sounds: Normal breath sounds.  ?Musculoskeletal:  ?   Cervical back: No rigidity or tenderness.  ?Lymphadenopathy:  ?   Cervical: No cervical adenopathy.  ?Skin: ?   General: Skin is warm and dry.  ?Neurological:  ?   Mental Status: He is alert and oriented to person, place, and time.  ?Psychiatric:     ?   Mood and Affect: Mood normal.     ?   Behavior: Behavior normal.  ? ? ?BP 126/72 (BP Location: Right Arm, Patient Position: Sitting, Cuff Size: Normal)   Pulse 77   Temp 98.2 ?F (36.8 ?C) (Temporal)   Ht '5\' 11"'$  (1.803 m)   Wt 182 lb 9.6 oz (82.8 kg)   SpO2 96%   BMI 25.47 kg/m?  ?Wt Readings from Last 3 Encounters:  ?12/09/21 182 lb 9.6 oz (82.8 kg)  ?09/10/21 184 lb 3.2 oz (83.6 kg)  ?07/01/21 185 lb 12.8 oz (84.3 kg)  ? ? ? ?Health Maintenance Due  ?Topic Date Due  ? TETANUS/TDAP  Never done  ? COLONOSCOPY (Pts 45-65yr Insurance coverage will need to be confirmed)  Never done  ? Zoster Vaccines- Shingrix (1 of 2) Never done  ? Pneumonia Vaccine 72 Years old (2 - PCV) 09/21/2018  ? ? ?There are no preventive care reminders to display for this patient. ? ?Lab Results  ?Component Value Date  ? TSH 2.10 09/22/2017  ? ?Lab Results  ?Component Value Date  ? WBC 5.5 09/16/2021  ? HGB 14.7 09/16/2021  ? HCT 44.2 09/16/2021  ? MCV 87.3 09/16/2021  ? PLT 231.0 09/16/2021  ? ?Lab Results  ?Component Value Date  ? NA 138 09/16/2021  ? K 4.2 09/16/2021  ? CO2 23 09/16/2021  ? GLUCOSE 100 (H) 09/16/2021  ? BUN 19 09/16/2021  ? CREATININE 0.91 09/16/2021  ? BILITOT 0.6 09/16/2021   ? ALKPHOS 50 09/16/2021  ? AST 18 09/16/2021  ? ALT 15 09/16/2021  ? PROT 6.5 09/16/2021  ? ALBUMIN 4.4 09/16/2021  ? CALCIUM 9.4 09/16/2021  ? ANIONGAP 7 05/12/2019  ? GFR 85.03 09/16/2021  ? ?  Lab Results  ?Component Value Date  ? CHOL 208 (H) 09/16/2021  ? ?Lab Results  ?Component Value Date  ? HDL 76.60 09/16/2021  ? ?Lab Results  ?Component Value Date  ? LDLCALC 118 (H) 09/16/2021  ? ?Lab Results  ?Component Value Date  ? TRIG 70.0 09/16/2021  ? ?Lab Results  ?Component Value Date  ? CHOLHDL 3 09/16/2021  ? ?No results found for: HGBA1C ? ?  ?Assessment & Plan:  ? ?Problem List Items Addressed This Visit   ? ?  ? Cardiovascular and Mediastinum  ? Erectile dysfunction due to arterial insufficiency  ? Relevant Medications  ? amLODipine (NORVASC) 5 MG tablet  ? atorvastatin (LIPITOR) 10 MG tablet  ? sildenafil (REVATIO) 20 MG tablet  ?  ? Other  ? Elevated LDL cholesterol level  ? Relevant Medications  ? atorvastatin (LIPITOR) 10 MG tablet  ? Alcohol use  ? ?Other Visit Diagnoses   ? ? Essential hypertension    -  Primary  ? Relevant Medications  ? amLODipine (NORVASC) 5 MG tablet  ? atorvastatin (LIPITOR) 10 MG tablet  ? sildenafil (REVATIO) 20 MG tablet  ? Need for Tdap vaccination      ? Relevant Orders  ? Tdap vaccine greater than or equal to 7yo IM  ? Need for shingles vaccine      ? Relevant Orders  ? Varicella-zoster vaccine IM (Shingrix)  ? ?  ? ? ?Meds ordered this encounter  ?Medications  ? amLODipine (NORVASC) 5 MG tablet  ?  Sig: Take 1 tablet (5 mg total) by mouth daily.  ?  Dispense:  90 tablet  ?  Refill:  1  ? atorvastatin (LIPITOR) 10 MG tablet  ?  Sig: Take 1 tablet (10 mg total) by mouth daily.  ?  Dispense:  90 tablet  ?  Refill:  3  ? DISCONTD: sildenafil (REVATIO) 20 MG tablet  ?  Sig: May take 3-5 45 minutes prior. No more than 5 daily  ?  Dispense:  50 tablet  ?  Refill:  1  ? sildenafil (REVATIO) 20 MG tablet  ?  Sig: May take 3-5 45 minutes prior. No more than 5 daily  ?  Dispense:  50  tablet  ?  Refill:  1  ? ? ?Follow-up: Return in about 6 months (around 06/10/2022).  ?Continue amlodipine for hypertension.  Continue low-dose statin for elevated ASCVD risk score.  Revatio as needed.  Discuss

## 2022-01-08 ENCOUNTER — Ambulatory Visit (AMBULATORY_SURGERY_CENTER): Payer: PPO | Admitting: *Deleted

## 2022-01-08 VITALS — Ht 71.0 in | Wt 180.0 lb

## 2022-01-08 DIAGNOSIS — Z1211 Encounter for screening for malignant neoplasm of colon: Secondary | ICD-10-CM

## 2022-01-08 NOTE — Progress Notes (Signed)

## 2022-01-24 ENCOUNTER — Encounter: Payer: Self-pay | Admitting: Certified Registered Nurse Anesthetist

## 2022-01-24 ENCOUNTER — Encounter: Payer: Self-pay | Admitting: Gastroenterology

## 2022-01-29 ENCOUNTER — Ambulatory Visit (AMBULATORY_SURGERY_CENTER): Payer: PPO | Admitting: Gastroenterology

## 2022-01-29 ENCOUNTER — Encounter: Payer: Self-pay | Admitting: Gastroenterology

## 2022-01-29 VITALS — BP 130/64 | HR 57 | Temp 97.5°F | Resp 15 | Ht 71.0 in | Wt 180.0 lb

## 2022-01-29 DIAGNOSIS — Z1211 Encounter for screening for malignant neoplasm of colon: Secondary | ICD-10-CM

## 2022-01-29 DIAGNOSIS — D125 Benign neoplasm of sigmoid colon: Secondary | ICD-10-CM

## 2022-01-29 MED ORDER — SODIUM CHLORIDE 0.9 % IV SOLN: 500.0000 mL | Freq: Once | INTRAVENOUS | Status: DC

## 2022-01-29 NOTE — Progress Notes (Signed)
[facility] History and Physical   Primary Care Physician:  Libby Maw, MD   Reason for Procedure:    colorectal cancer screening  Plan:     colonoscopy     HPI: Kerry Bright is a 71 y.o. male    for screening  Previous colon 2013 by Dr. Alice Reichert- negative except hyperplastic polyps No nausea, vomiting, heartburn, regurgitation, odynophagia or dysphagia.  No significant diarrhea or constipation.  No melena or hematochezia. No unintentional weight loss. No abdominal pain.  Past Medical History:  Diagnosis Date   Allergy    SEASONAL   Arthritis    OA   Cancer ([place])    SKIN CANCER- BASAL CELL PER PT   History of kidney stones    Hyperlipidemia    Hypertension    CONTROLLED    Past Surgical History:  Procedure Laterality Date   basal areas removed     ear top of head face   COLONOSCOPY     POLYPECTOMY  2013   HPP X 2   right foot lesion drained  2000   TONSILLECTOMY     TOTAL HIP ARTHROPLASTY Left 05/11/2019   Procedure: TOTAL HIP ARTHROPLASTY ANTERIOR APPROACH;  Surgeon: Rod Can, MD;  Location: [facility];  Service: Orthopedics;  Laterality: Left;    Prior to Admission medications   Medication Sig Start Date End Date Taking? Authorizing Provider  amLODipine (NORVASC) 5 MG tablet Take 1 tablet (5 mg total) by mouth daily. 12/09/21  Yes Libby Maw, MD  atorvastatin (LIPITOR) 10 MG tablet Take 1 tablet (10 mg total) by mouth daily. 12/09/21  Yes Libby Maw, MD  Multiple Vitamins-Minerals (MULTIVITAMIN ADULT) CHEW Chew by mouth. CENTRUM MEN'S MULTIVITAMIN GUMMIES    [provider]  sildenafil (REVATIO) 20 MG tablet May take 3-5 45 minutes prior. No more than 5 daily 12/09/21   Libby Maw, MD  TURMERIC PO Take by mouth.    [provider]  VITAMIN D PO Take by mouth.    [provider]    Current Outpatient Medications  Medication Sig Dispense Refill   amLODipine (NORVASC) 5 MG  tablet Take 1 tablet (5 mg total) by mouth daily. 90 tablet 1   atorvastatin (LIPITOR) 10 MG tablet Take 1 tablet (10 mg total) by mouth daily. 90 tablet 3   Multiple Vitamins-Minerals (MULTIVITAMIN ADULT) CHEW Chew by mouth. CENTRUM MEN'S MULTIVITAMIN GUMMIES     sildenafil (REVATIO) 20 MG tablet May take 3-5 45 minutes prior. No more than 5 daily 50 tablet 1   TURMERIC PO Take by mouth.     VITAMIN D PO Take by mouth.     Current Facility-Administered Medications  Medication Dose Route Frequency Provider Last Rate Last Admin   0.9 %  sodium chloride infusion  500 mL Intravenous Once Jackquline Denmark, MD        Allergies as of 01/29/2022   (No Known Allergies)    Family History  Problem Relation Age of Onset   Cancer Father    Cancer Daughter    Colon cancer Neg Hx    Colon polyps Neg Hx    Esophageal cancer Neg Hx    Rectal cancer Neg Hx    Stomach cancer Neg Hx     Social History   Socioeconomic History   Marital status: Married    Spouse name: Not on file   Number of children: Not on file   Years of education: Not on file  Highest education level: Bachelor's degree (e.g., BA, AB, BS)  Occupational History   Not on file  Tobacco Use   Smoking status: Former    Packs/day: 1.50    Years: 6.00    Pack years: 9.00    Types: Cigarettes   Smokeless tobacco: Former   Tobacco comments:    quit 1980  Vaping Use   Vaping Use: Never used  Substance and Sexual Activity   Alcohol use: Yes    Comment: 2-3 beers per day   Drug use: Never   Sexual activity: Not Currently  Other Topics Concern   Not on file  Social History Narrative   Not on file   Social Determinants of Health   Financial Resource Strain: Low Risk    Difficulty of Paying Living Expenses: Not hard at all  Food Insecurity: No Food Insecurity   Worried About Charity fundraiser in the Last Year: Never true   Ran Out of Food in the Last Year: Never true  Transportation Needs: No Transportation Needs    Lack of Transportation (Medical): No   Lack of Transportation (Non-Medical): No  Physical Activity: Insufficiently Active   Days of Exercise per Week: 2 days   Minutes of Exercise per Session: 60 min  Stress: No Stress Concern Present   Feeling of Stress : Only a little  Social Connections: Unknown   Frequency of Communication with Friends and Family: Twice a week   Frequency of Social Gatherings with Friends and Family: Patient refused   Attends Religious Services: 1 to 4 times per year   Active Member of [employer] or Organizations: No   Attends Music therapist: Not on file   Marital Status: Married  Human resources officer Violence: Not on file    Review of Systems: Positive for none All other review of systems negative except as mentioned in the HPI.  Physical Exam: Vital signs in last 24 hours: '@VSRANGES'$ @   General:   Alert,  Well-developed, well-nourished, pleasant and cooperative in NAD Lungs:  Clear throughout to auscultation.   Heart:  Regular rate and rhythm; no murmurs, clicks, rubs,  or gallops. Abdomen:  Soft, nontender and nondistended. Normal bowel sounds.   Neuro/Psych:  Alert and cooperative. Normal mood and affect. A and O x 3    No significant changes were identified.  The patient continues to be an appropriate candidate for the planned procedure and anesthesia.   Carmell Austria, MD. [facility] 01/29/2022 1:25 PM@

## 2022-01-29 NOTE — Patient Instructions (Signed)
Handouts on hemorrhoids, diverticulosis, polyps, and high fiber diet given to patient. Await pathology results. Resume previous diet and continue present medications.   YOU HAD AN ENDOSCOPIC PROCEDURE TODAY AT [facility]:   Refer to the procedure report that was given to you for any specific questions about what was found during the examination.  If the procedure report does not answer your questions, please call your gastroenterologist to clarify.  If you requested that your care partner not be given the details of your procedure findings, then the procedure report has been included in a sealed envelope for you to review at your convenience later.  YOU SHOULD EXPECT: Some feelings of bloating in the abdomen. Passage of more gas than usual.  Walking can help get rid of the air that was put into your GI tract during the procedure and reduce the bloating. If you had a lower endoscopy (such as a colonoscopy or flexible sigmoidoscopy) you may notice spotting of blood in your stool or on the toilet paper. If you underwent a bowel prep for your procedure, you may not have a normal bowel movement for a few days.  Please Note:  You might notice some irritation and congestion in your nose or some drainage.  This is from the oxygen used during your procedure.  There is no need for concern and it should clear up in a day or so.  SYMPTOMS TO REPORT IMMEDIATELY:  Following lower endoscopy (colonoscopy or flexible sigmoidoscopy):  Excessive amounts of blood in the stool  Significant tenderness or worsening of abdominal pains  Swelling of the abdomen that is new, acute  Fever of 100F or higher  For urgent or emergent issues, a gastroenterologist can be reached at any hour by calling [phone]. Do not use MyChart messaging for urgent concerns.    DIET:  We do recommend a small meal at first, but then you may proceed to your regular diet.  Drink plenty of fluids but you should avoid  alcoholic beverages for 24 hours.  ACTIVITY:  You should plan to take it easy for the rest of today and you should NOT DRIVE or use heavy machinery until tomorrow (because of the sedation medicines used during the test).    FOLLOW UP: Our staff will call the number listed on your records 48-72 hours following your procedure to check on you and address any questions or concerns that you may have regarding the information given to you following your procedure. If we do not reach you, we will leave a message.  We will attempt to reach you two times.  During this call, we will ask if you have developed any symptoms of COVID 19. If you develop any symptoms (ie: fever, flu-like symptoms, shortness of breath, cough etc.) before then, please call [phone].  If you test positive for Covid 19 in the 2 weeks post procedure, please call and report this information to [place].    If any biopsies were taken you will be contacted by phone or by letter within the next 1-3 weeks.  Please call us at [phone] if you have not heard about the biopsies in 3 weeks.    SIGNATURES/CONFIDENTIALITY: You and/or your care partner have signed paperwork which will be entered into your electronic medical record.  These signatures attest to the fact that that the information above on your After Visit Summary has been reviewed and is understood.  Full responsibility of the confidentiality of this discharge information lies with  you and/or your care-partner.

## 2022-01-29 NOTE — Op Note (Signed)
[facility] Patient Name: Kerry Bright Procedure Date: 01/29/2022 1:30 PM MRN: [id number] Endoscopist: Jackquline Denmark , MD Age: 71 Referring MD:  Date of Birth: 11-Sep-1950 Gender: Male Account #: [id number] Procedure:                Colonoscopy Indications:              Screening for colorectal malignant neoplasm Medicines:                Monitored Anesthesia Care Procedure:                Pre-Anesthesia Assessment:                           - Prior to the procedure, a History and Physical                            was performed, and patient medications and                            allergies were reviewed. The patient's tolerance of                            previous anesthesia was also reviewed. The risks                            and benefits of the procedure and the sedation                            options and risks were discussed with the patient.                            All questions were answered, and informed consent                            was obtained. Prior Anticoagulants: The patient has                            taken no previous anticoagulant or antiplatelet                            agents. ASA Grade Assessment: II - A patient with                            mild systemic disease. After reviewing the risks                            and benefits, the patient was deemed in                            satisfactory condition to undergo the procedure.                           After obtaining informed consent, the colonoscope  was passed under direct vision. Throughout the                            procedure, the patient's blood pressure, pulse, and                            oxygen saturations were monitored continuously. The                            Olympus CF-HQ190L (Serial# [id number]) Colonoscope was                            introduced through the anus and advanced to the the                            cecum, identified  by appendiceal orifice and                            ileocecal valve. The colonoscopy was performed                            without difficulty. The patient tolerated the                            procedure well. The quality of the bowel                            preparation was adequate to identify polyps. The                            ileocecal valve, appendiceal orifice, and rectum                            were photographed. Scope In: 1:31:50 PM Scope Out: 1:47:06 PM Scope Withdrawal Time: 0 hours 12 minutes 13 seconds  Total Procedure Duration: 0 hours 15 minutes 16 seconds  Findings:                 A 10 mm polyp was found in the distal sigmoid                            colon, 20 cm from the anal verge. The polyp was                            semi-pedunculated. The polyp was removed with a hot                            snare. Resection and retrieval were complete.                           A few medium-mouthed diverticula were found in the                            sigmoid colon.  Non-bleeding internal hemorrhoids were found during                            retroflexion. The hemorrhoids were small and Grade                            I (internal hemorrhoids that do not prolapse).                           The exam was otherwise without abnormality on                            direct and retroflexion views. Complications:            No immediate complications. Estimated Blood Loss:     Estimated blood loss: none. Impression:               - One 10 mm polyp in the distal sigmoid colon,                            removed with a hot snare. Resected and retrieved.                           - Mild sigmoid diverticulosis.                           - Non-bleeding internal hemorrhoids.                           - The examination was otherwise normal on direct                            and retroflexion views. Recommendation:           - Patient has a  contact number available for                            emergencies. The signs and symptoms of potential                            delayed complications were discussed with the                            patient. Return to normal activities tomorrow.                            Written discharge instructions were provided to the                            patient.                           - High fiber diet.                           - Continue present medications.                           -  Await pathology results.                           - Repeat colonoscopy for surveillance based on                            pathology results.                           - The findings and recommendations were discussed                            with the patient's family [name]). Jackquline Denmark, MD 01/29/2022 1:51:44 PM This report has been signed electronically.

## 2022-01-29 NOTE — Progress Notes (Signed)
Sedate, gd SR, tolerated procedure well, VSS, report to RN 

## 2022-01-29 NOTE — Progress Notes (Signed)
Called to room to assist during endoscopic procedure.  Patient ID and intended procedure confirmed with present staff. Received instructions for my participation in the procedure from the performing physician.  

## 2022-01-29 NOTE — Progress Notes (Signed)
°  Vs by DT in adm ° °Pt's states no medical or surgical changes since previsit or office visit.  °

## 2022-01-30 ENCOUNTER — Telehealth: Payer: Self-pay

## 2022-01-30 NOTE — Telephone Encounter (Signed)
Left message on follow up call. 

## 2022-01-30 NOTE — Telephone Encounter (Signed)
  Follow up Call-     01/29/2022   12:54 PM  Call back number  Post procedure Call Back phone  # [phone]  Permission to leave phone message Yes     Patient questions:  Do you have a fever, pain , or abdominal swelling? No. Pain Score  0 *  Have you tolerated food without any problems? Yes.    Have you been able to return to your normal activities? Yes.    Do you have any questions about your discharge instructions: Diet   No. Medications  No. Follow up visit  No.  Do you have questions or concerns about your Care? No.  Actions: * If pain score is 4 or above: No action needed, pain <4.

## 2022-02-06 ENCOUNTER — Encounter: Payer: Self-pay | Admitting: Family Medicine

## 2022-02-06 ENCOUNTER — Ambulatory Visit (INDEPENDENT_AMBULATORY_CARE_PROVIDER_SITE_OTHER): Payer: PPO | Admitting: Family Medicine

## 2022-02-06 ENCOUNTER — Other Ambulatory Visit (HOSPITAL_COMMUNITY)
Admission: RE | Admit: 2022-02-06 | Discharge: 2022-02-06 | Disposition: A | Discharge status: Home / Self Care | Payer: PPO | Source: Ambulatory Visit | Attending: Family Medicine | Admitting: Family Medicine

## 2022-02-06 VITALS — BP 120/68 | HR 81 | Temp 97.8°F | Ht 71.0 in | Wt 179.4 lb

## 2022-02-06 DIAGNOSIS — N453 Epididymo-orchitis: Secondary | ICD-10-CM | POA: Diagnosis present

## 2022-02-06 DIAGNOSIS — N5089 Other specified disorders of the male genital organs: Secondary | ICD-10-CM | POA: Diagnosis not present

## 2022-02-06 MED ORDER — DOXYCYCLINE HYCLATE 100 MG PO TABS: 100.0000 mg | ORAL_TABLET | Freq: Two times a day (BID) | ORAL | 0 refills | Status: AC

## 2022-02-06 NOTE — Progress Notes (Signed)
Established Patient Office Visit  Subjective   Patient ID: Kerry Bright, male    DOB: Jul 16, 1951  Age: 71 y.o. MRN: [id number]  Chief Complaint  Patient presents with   Testicle Pain    C/O discomfort in scrotum area x 2 weeks.     Testicle Pain The patient's primary symptoms include testicular pain. Pertinent negatives include no abdominal pain, dysuria, flank pain, frequency, rash or urgency.  for evaluation of mild discomfort in the left testicle area.  There is been no fever or chills, low back pain, changes in stooling, hematuria, dysuria, discharge.  Urine flow is okay and there have been no changes.  Married in a stable relationship.  Stay active sexually a few months ago.    Review of Systems  Constitutional: Negative.   HENT: Negative.    Eyes:  Negative for blurred vision, discharge and redness.  Respiratory: Negative.    Cardiovascular: Negative.   Gastrointestinal:  Negative for abdominal pain.  Genitourinary:  Positive for testicular pain. Negative for dysuria, flank pain, frequency, hematuria and urgency.  Musculoskeletal: Negative.  Negative for myalgias.  Skin:  Negative for rash.  Neurological:  Negative for tingling, loss of consciousness and weakness.  Endo/Heme/Allergies:  Negative for polydipsia.     Objective:     BP 120/68 (BP Location: Right Arm, Patient Position: Sitting, Cuff Size: Normal)   Pulse 81   Temp 97.8 F (36.6 C) (Temporal)   Ht '5\' 11"'$  (1.803 m)   Wt 179 lb 6.4 oz (81.4 kg)   SpO2 97%   BMI 25.02 kg/m    Physical Exam Constitutional:      General: He is not in acute distress.    Appearance: Normal appearance. He is not ill-appearing, toxic-appearing or diaphoretic.  HENT:     Head: Normocephalic and atraumatic.     Right Ear: External ear normal.     Left Ear: External ear normal.  Eyes:     General: No scleral icterus.       Right eye: No discharge.        Left eye: No discharge.     Extraocular Movements: Extraocular  movements intact.     Conjunctiva/sclera: Conjunctivae normal.  Abdominal:     Hernia: There is no hernia in the left inguinal area or right inguinal area.  Genitourinary:    Penis: Circumcised. No hypospadias, erythema, tenderness, discharge, swelling or lesions.      Testes:        Right: Mass present. Tenderness or swelling not present. Right testis is descended.        Left: Tenderness present. Mass or swelling not present. Left testis is descended.     Epididymis:     Right: Enlarged. Not inflamed.     Left: Not enlarged.  Musculoskeletal:     Cervical back: No rigidity or tenderness.  Lymphadenopathy:     Lower Body: No right inguinal adenopathy. No left inguinal adenopathy.  Skin:    General: Skin is warm and dry.  Neurological:     Mental Status: He is alert and oriented to person, place, and time.  Psychiatric:        Mood and Affect: Mood normal.        Behavior: Behavior normal.     No results found for any visits on 02/06/22.    The 10-year ASCVD risk score (Arnett DK, et al., 2019) is: 17.2%    Assessment & Plan:   Problem List Items Addressed This Visit  Genitourinary   Orchitis and epididymitis - Primary   Relevant Medications   doxycycline (VIBRA-TABS) 100 MG tablet   Other Relevant Orders   Urinalysis, Routine w reflex microscopic   Urine cytology ancillary only     Other   Testicular mass   Relevant Orders   US SCROTUM W/DOPPLER    Return in about 2 weeks (around 02/20/2022), or if symptoms worsen or fail to improve.    Libby Maw, MD

## 2022-02-07 LAB — URINALYSIS, ROUTINE W REFLEX MICROSCOPIC
Bilirubin Urine: NEGATIVE
Hgb urine dipstick: NEGATIVE
Ketones, ur: NEGATIVE
Leukocytes,Ua: NEGATIVE
Nitrite: NEGATIVE
RBC / HPF: NONE SEEN (ref 0–?)
Specific Gravity, Urine: >=1.03 — AB (ref 1.000–1.030)
Total Protein, Urine: NEGATIVE
Urine Glucose: NEGATIVE
Urobilinogen, UA: 0.2 (ref 0.0–1.0)
WBC, UA: NONE SEEN (ref 0–?)
pH: 5.5 (ref 5.0–8.0)

## 2022-02-08 ENCOUNTER — Encounter: Payer: Self-pay | Admitting: Gastroenterology

## 2022-02-10 LAB — URINE CYTOLOGY ANCILLARY ONLY
Chlamydia: NEGATIVE
Comment: NEGATIVE

## 2022-02-11 ENCOUNTER — Telehealth (HOSPITAL_BASED_OUTPATIENT_CLINIC_OR_DEPARTMENT_OTHER): Payer: Self-pay

## 2022-02-12 ENCOUNTER — Ambulatory Visit (HOSPITAL_BASED_OUTPATIENT_CLINIC_OR_DEPARTMENT_OTHER)
Admission: RE | Admit: 2022-02-12 | Discharge: 2022-02-12 | Disposition: A | Discharge status: Home / Self Care | Payer: PPO | Source: Ambulatory Visit | Attending: Family Medicine | Admitting: Family Medicine

## 2022-02-12 DIAGNOSIS — N5089 Other specified disorders of the male genital organs: Secondary | ICD-10-CM | POA: Diagnosis not present

## 2022-02-20 ENCOUNTER — Ambulatory Visit (INDEPENDENT_AMBULATORY_CARE_PROVIDER_SITE_OTHER): Payer: PPO | Admitting: Family Medicine

## 2022-02-20 ENCOUNTER — Encounter: Payer: Self-pay | Admitting: Family Medicine

## 2022-02-20 VITALS — BP 140/70 | HR 78 | Temp 97.4°F | Ht 71.0 in | Wt 181.0 lb

## 2022-02-20 DIAGNOSIS — N433 Hydrocele, unspecified: Secondary | ICD-10-CM

## 2022-02-20 NOTE — Progress Notes (Signed)
   Established Patient Office Visit  Subjective   Patient ID: Kerry Bright, male    DOB: July 07, 1951  Age: 71 y.o. MRN: [id number]  Chief Complaint  Patient presents with   Follow-up    2 week follow up would like to discuss U/S    HPI follow-up of scrotal ultrasound that revealed bilateral hydroceles and bilateral epididymal cysts.  Symptoms are improving.  There is less pain.  He has finished his doxycycline.  Through the years he has had discomfort in the scrotal area that has been fleeting and relatively minor.      Review of Systems  Constitutional: Negative.   HENT: Negative.    Eyes:  Negative for blurred vision, discharge and redness.  Respiratory: Negative.    Cardiovascular: Negative.   Gastrointestinal:  Negative for abdominal pain.  Genitourinary: Negative.  Negative for dysuria, frequency, hematuria and urgency.  Musculoskeletal: Negative.  Negative for myalgias.  Skin:  Negative for rash.  Neurological:  Negative for tingling, loss of consciousness and weakness.  Endo/Heme/Allergies:  Negative for polydipsia.      Objective:     BP 140/70 (BP Location: Left Arm, Patient Position: Sitting, Cuff Size: Normal)   Pulse 78   Temp (!) 97.4 F (36.3 C) (Temporal)   Ht '5\' 11"'$  (1.803 m)   Wt 181 lb (82.1 kg)   SpO2 96%   BMI 25.24 kg/m    Physical Exam Constitutional:      General: He is not in acute distress.    Appearance: Normal appearance. He is not ill-appearing, toxic-appearing or diaphoretic.  HENT:     Head: Normocephalic and atraumatic.     Right Ear: External ear normal.     Left Ear: External ear normal.  Eyes:     General: No scleral icterus.       Right eye: No discharge.        Left eye: No discharge.     Extraocular Movements: Extraocular movements intact.     Conjunctiva/sclera: Conjunctivae normal.  Pulmonary:     Effort: Pulmonary effort is normal. No respiratory distress.  Skin:    General: Skin is warm and dry.  Neurological:      Mental Status: He is alert and oriented to person, place, and time.  Psychiatric:        Mood and Affect: Mood normal.        Behavior: Behavior normal.      No results found for any visits on 02/20/22.    The 10-year ASCVD risk score (Arnett DK, et al., 2019) is: 22%    Assessment & Plan:   Problem List Items Addressed This Visit       Genitourinary   Hydrocele - Primary    Return if symptoms worsen or fail to improve.  Reassured patient that scrotal ultrasound revealed essentially normal variants.  Would recommend a urology referral if pain returns or persists or if there are any future changes in the scrotal area.  Libby Maw, MD

## 2022-03-12 ENCOUNTER — Ambulatory Visit (INDEPENDENT_AMBULATORY_CARE_PROVIDER_SITE_OTHER): Payer: PPO

## 2022-03-12 DIAGNOSIS — Z0000 Encounter for general adult medical examination without abnormal findings: Secondary | ICD-10-CM | POA: Diagnosis not present

## 2022-03-12 NOTE — Progress Notes (Addendum)
I have personally reviewed this encounter including the documentation in this note and have collaborated with the care management provider regarding care management and care coordination activities to include development and update of the comprehensive care plan. I am certifying that I agree with the content of this note and encounter as supervising physician.    Subjective:   Kerry Bright is a 71 y.o. male who presents for an Initial   Medicare Annual Wellness Visit.   I connected with [name] today by telephone and verified that I am speaking with the correct person using two identifiers. Location patient: home Location provider: work Persons participating in the virtual visit: patient, provider.   I discussed the limitations, risks, security and privacy concerns of performing an evaluation and management service by telephone and the availability of in person appointments. I also discussed with the patient that there may be a patient responsible charge related to this service. The patient expressed understanding and verbally consented to this telephonic visit.    Interactive audio and video telecommunications were attempted between this provider and patient, however failed, due to patient having technical difficulties OR patient did not have access to video capability.  We continued and completed visit with audio only.    Review of Systems     Cardiac Risk Factors include: advanced age (>34mn, >>1women);male gender     Objective:    Today's Vitals   There is no height or weight on file to calculate BMI.     03/12/2022   11:24 AM 05/11/2019   12:20 PM 05/11/2019    6:49 AM 05/04/2019    8:15 AM  Advanced Directives  Does Patient Have a Medical Advance Directive? Yes Yes Yes Yes  Type of AParamedicof AHoffmanLiving will HOvidLiving will HBarnwellLiving will HH[place]Living will  Does  patient want to make changes to medical advance directive?  No - Patient declined No - Patient declined No - Patient declined  Copy of [place] Chart? No - copy requested No - copy requested No - copy requested     Current Medications (verified) Outpatient Encounter Medications as of 03/12/2022  Medication Sig   amLODipine (NORVASC) 5 MG tablet Take 1 tablet (5 mg total) by mouth daily.   atorvastatin (LIPITOR) 10 MG tablet Take 1 tablet (10 mg total) by mouth daily.   Multiple Vitamins-Minerals (MULTIVITAMIN ADULT) CHEW Chew by mouth. CENTRUM MEN'S MULTIVITAMIN GUMMIES   sildenafil (REVATIO) 20 MG tablet May take 3-5 45 minutes prior. No more than 5 daily   TURMERIC PO Take by mouth.   VITAMIN D PO Take by mouth.   No facility-administered encounter medications on file as of 03/12/2022.    Allergies (verified) Patient has no known allergies.   History: Past Medical History:  Diagnosis Date   Allergy    SEASONAL   Arthritis    OA   [place]    SKIN CANCER- BASAL CELL PER PT   History of kidney stones    Hyperlipidemia    Hypertension    CONTROLLED   Past Surgical History:  Procedure Laterality Date   basal areas removed     ear top of head face   COLONOSCOPY     POLYPECTOMY  2013   HPP X 2   right foot lesion drained  2000   TONSILLECTOMY     TOTAL HIP ARTHROPLASTY Left 05/11/2019   Procedure: TOTAL HIP  ARTHROPLASTY ANTERIOR APPROACH;  Surgeon: Rod Can, MD;  Location: [facility];  Service: Orthopedics;  Laterality: Left;   Family History  Problem Relation Age of Onset   Cancer Father    Cancer Daughter    Colon cancer Neg Hx    Colon polyps Neg Hx    Esophageal cancer Neg Hx    Rectal cancer Neg Hx    Stomach cancer Neg Hx    Social History   Socioeconomic History   Marital status: Married    Spouse name: Not on file   Number of children: Not on file   Years of education: Not on file   Highest education level: Bachelor's degree  (e.g., BA, AB, BS)  Occupational History   Not on file  Tobacco Use   Smoking status: Former    Packs/day: 1.50    Years: 6.00    Total pack years: 9.00    Types: Cigarettes   Smokeless tobacco: Former   Tobacco comments:    quit 1980  Vaping Use   Vaping Use: Never used  Substance and Sexual Activity   Alcohol use: Yes    Comment: 2-3 beers per day   Drug use: Never   Sexual activity: Not Currently  Other Topics Concern   Not on file  Social History Narrative   Not on file   Social Determinants of Health   Financial Resource Strain: Low Risk  (03/12/2022)   Overall Financial Resource Strain (CARDIA)    Difficulty of Paying Living Expenses: Not hard at all  Food Insecurity: No Food Insecurity (03/12/2022)   Hunger Vital Sign    Worried About Running Out of Food in the Last Year: Never true    Ran Out of Food in the Last Year: Never true  Transportation Needs: No Transportation Needs (03/12/2022)   PRAPARE - Hydrologist (Medical): No    Lack of Transportation (Non-Medical): No  Physical Activity: Sufficiently Active (03/12/2022)   Exercise Vital Sign    Days of Exercise per Week: 5 days    Minutes of Exercise per Session: 30 min  Stress: No Stress Concern Present (03/12/2022)   [place]    Feeling of Stress : Not at all  Social Connections: Moderately Integrated (03/12/2022)   Social Connection and Isolation Panel [NHANES]    Frequency of Communication with Friends and Family: Twice a week    Frequency of Social Gatherings with Friends and Family: Twice a week    Attends Religious Services: 1 to 4 times per year    Active Member of [employer] or Organizations: No    Attends Music therapist: Never    Marital Status: Married    Tobacco Counseling Counseling given: Not Answered Tobacco comments: quit 1980   Clinical Intake:  Pre-visit preparation completed:  Yes  Pain : No/denies pain     Nutritional Risks: None Diabetes: No  How often do you need to have someone help you when you read instructions, pamphlets, or other written materials from your doctor or pharmacy?: 1 - Never What is the last grade level you completed in school?: BA  Diabetic?no   Interpreter Needed?: No  Information entered by :: [place] of Daily Living    03/12/2022   11:25 AM  In your present state of health, do you have any difficulty performing the following activities:  Hearing? 0  Vision? 0  Difficulty concentrating or making decisions?  0  Walking or climbing stairs? 0  Dressing or bathing? 0  Doing errands, shopping? 0  Preparing Food and eating ? N  Using the Toilet? N  In the past six months, have you accidently leaked urine? N  Do you have problems with loss of bowel control? N  Managing your Medications? N  Managing your Finances? N  Housekeeping or managing your Housekeeping? N    Patient Care Team: Libby Maw, MD as PCP - General (Family Medicine)  Indicate any recent Medical Services you may have received from other than [facility] providers in the past year (date may be approximate).     Assessment:   This is a routine wellness examination for [facility].  Hearing/Vision screen Vision Screening - Comments:: Annual eye exams wear glasses   Dietary issues and exercise activities discussed: Current Exercise Habits: Home exercise routine, Type of exercise: walking, Time (Minutes): 30, Frequency (Times/Week): 5, Weekly Exercise (Minutes/Week): 150, Intensity: Mild, Exercise limited by: None identified   Goals Addressed   None    Depression Screen    03/12/2022   11:23 AM 03/12/2022   11:21 AM 12/09/2021    1:47 PM 12/09/2021    1:45 PM 10/03/2021    3:33 PM 09/10/2021    2:11 PM 09/10/2021    1:40 PM  PHQ 2/9 Scores  PHQ - 2 Score 0 0 0 0 0 0 0  PHQ- 9 Score      0     Fall Risk    03/12/2022   11:25 AM 12/09/2021     1:47 PM 12/09/2021    1:45 PM 10/03/2021    3:33 PM 09/10/2021    1:40 PM  [place] in the past year? 0 0 0 0 0  Number falls in past yr: 0 0 0 0   Injury with Fall? 0      Risk for fall due to : No Fall Risks      Follow up Falls evaluation completed;Education provided        FALL RISK PREVENTION PERTAINING TO THE HOME:  Any stairs in or around the home? Yes  If so, are there any without handrails? No  Home free of loose throw rugs in walkways, pet beds, electrical cords, etc? Yes  Adequate lighting in your home to reduce risk of falls? Yes   ASSISTIVE DEVICES UTILIZED TO PREVENT FALLS:  Life alert? No  Use of a cane, walker or w/c? No  Grab bars in the bathroom? Yes  Shower chair or bench in shower? Yes  Elevated toilet seat or a handicapped toilet? No    Cognitive Function:    Normal cognitive status assessed by Telephone conversation  by this Nurse Health Advisor. No abnormalities found.      Immunizations Immunization History  Administered Date(s) Administered   Fluad Quad(high Dose 65+) 07/01/2021   Influenza, High Dose Seasonal PF 09/21/2017   Influenza,inj,Quad PF,6+ Mos 10/05/2018   PFIZER(Purple Top)SARS-COV-2 Vaccination 12/22/2019, 01/12/2020, 12/21/2020   Pneumococcal Polysaccharide-23 09/21/2017   Tdap 12/09/2021   Zoster Recombinat (Shingrix) 12/09/2021    TDAP status: Up to date  Flu Vaccine status: Up to date  Pneumococcal vaccine status: Up to date  Covid-19 vaccine status: Completed vaccines  Qualifies for Shingles Vaccine? Yes   Zostavax completed Yes   Shingrix Completed?: Yes  Screening Tests Health Maintenance  Topic Date Due   Zoster Vaccines- Shingrix (2 of 2) 05/09/2022 (Originally 02/03/2022)   Pneumonia Vaccine 65+ Years  old (2 - PCV) 02/06/2023 (Originally 09/21/2018)   INFLUENZA VACCINE  04/08/2022   COLONOSCOPY (Pts 45-75yr Insurance coverage will need to be confirmed)  01/29/2025   TETANUS/TDAP  12/10/2031    Hepatitis C Screening  Completed   HPV VACCINES  Aged Out   COVID-19 Vaccine  Discontinued    Health Maintenance  There are no preventive care reminders to display for this patient.  Colorectal cancer screening: Type of screening: Colonoscopy. Completed 01/29/2022. Repeat every 3 years  Lung Cancer Screening: (Low Dose CT Chest recommended if Age 72-80years, 30 pack-year currently smoking OR have quit w/in 15years.) does not qualify.   Lung Cancer Screening Referral: n/a  Additional Screening:  Hepatitis C Screening: does not qualify;   Vision Screening: Recommended annual ophthalmology exams for early detection of glaucoma and other disorders of the eye. Is the patient up to date with their annual eye exam?  Yes  Who is the provider or what is the name of the office in which the patient attends annual eye exams? Dr,Headly  If pt is not established with a provider, would they like to be referred to a provider to establish care? No .   Dental Screening: Recommended annual dental exams for proper oral hygiene  Community Resource Referral / Chronic Care Management: CRR required this visit?  No   CCM required this visit?  No      Plan:     I have personally reviewed and noted the following in the patient's chart:   Medical and social history Use of alcohol, tobacco or illicit drugs  Current medications and supplements including opioid prescriptions. Patient is not currently taking opioid prescriptions. Functional ability and status Nutritional status Physical activity Advanced directives List of other physicians Hospitalizations, surgeries, and ER visits in previous 12 months Vitals Screenings to include cognitive, depression, and falls Referrals and appointments  In addition, I have reviewed and discussed with patient certain preventive protocols, quality metrics, and best practice recommendations. A written personalized care plan for preventive services as well as  general preventive health recommendations were provided to patient.     LRandel Pigg LPN   [id number]  Nurse Notes: none

## 2022-03-12 NOTE — Patient Instructions (Signed)
[name] , Thank you for taking time to come for your Medicare Wellness Visit. I appreciate your ongoing commitment to your health goals. Please review the following plan we discussed and let me know if I can assist you in the future.   Screening recommendations/referrals: Colonoscopy: 01/29/2022 Recommended yearly ophthalmology/optometry visit for glaucoma screening and checkup Recommended yearly dental visit for hygiene and checkup  Vaccinations: Influenza vaccine: completed  Pneumococcal vaccine: completed  Tdap vaccine: 12/09/2021 Shingles vaccine: completed     Advanced directives: none   Conditions/risks identified: none   Next appointment: none   Preventive Care 72 Years and Older, Male Preventive care refers to lifestyle choices and visits with your health care provider that can promote health and wellness. What does preventive care include? A yearly physical exam. This is also called an annual well check. Dental exams once or twice a year. Routine eye exams. Ask your health care provider how often you should have your eyes checked. Personal lifestyle choices, including: Daily care of your teeth and gums. Regular physical activity. Eating a healthy diet. Avoiding tobacco and drug use. Limiting alcohol use. Practicing safe sex. Taking low doses of aspirin every day. Taking vitamin and mineral supplements as recommended by your health care provider. What happens during an annual well check? The services and screenings done by your health care provider during your annual well check will depend on your age, overall health, lifestyle risk factors, and family history of disease. Counseling  Your health care provider may ask you questions about your: Alcohol use. Tobacco use. Drug use. Emotional well-being. Home and relationship well-being. Sexual activity. Eating habits. History of falls. Memory and ability to understand (cognition). Work and work  Statistician. Screening  You may have the following tests or measurements: Height, weight, and BMI. Blood pressure. Lipid and cholesterol levels. These may be checked every 5 years, or more frequently if you are over 72 years old. Skin check. Lung cancer screening. You may have this screening every year starting at age 72 if you have a 30-pack-year history of smoking and currently smoke or have quit within the past 15 years. Fecal occult blood test (FOBT) of the stool. You may have this test every year starting at age 72. Flexible sigmoidoscopy or colonoscopy. You may have a sigmoidoscopy every 5 years or a colonoscopy every 10 years starting at age 72. Prostate cancer screening. Recommendations will vary depending on your family history and other risks. Hepatitis C blood test. Hepatitis B blood test. Sexually transmitted disease (STD) testing. Diabetes screening. This is done by checking your blood sugar (glucose) after you have not eaten for a while (fasting). You may have this done every 1-3 years. Abdominal aortic aneurysm (AAA) screening. You may need this if you are a current or former smoker. Osteoporosis. You may be screened starting at age 72 if you are at high risk. Talk with your health care provider about your test results, treatment options, and if necessary, the need for more tests. Vaccines  Your health care provider may recommend certain vaccines, such as: Influenza vaccine. This is recommended every year. Tetanus, diphtheria, and acellular pertussis (Tdap, Td) vaccine. You may need a Td booster every 10 years. Zoster vaccine. You may need this after age 72. Pneumococcal 13-valent conjugate (PCV13) vaccine. One dose is recommended after age 72. Pneumococcal polysaccharide (PPSV23) vaccine. One dose is recommended after age 72. Talk to your health care provider about which screenings and vaccines you need and how often you need  them. This information is not intended to replace  advice given to you by your health care provider. Make sure you discuss any questions you have with your health care provider. Document Released: 09/21/2015 Document Revised: 05/14/2016 Document Reviewed: 06/26/2015 Elsevier Interactive Patient Education  2017 [place] Prevention in the Home Falls can cause injuries. They can happen to people of all ages. There are many things you can do to make your home safe and to help prevent falls. What can I do on the outside of my home? Regularly fix the edges of walkways and driveways and fix any cracks. Remove anything that might make you trip as you walk through a door, such as a raised step or threshold. Trim any bushes or trees on the path to your home. Use bright outdoor lighting. Clear any walking paths of anything that might make someone trip, such as rocks or tools. Regularly check to see if handrails are loose or broken. Make sure that both sides of any steps have handrails. Any raised decks and porches should have guardrails on the edges. Have any leaves, snow, or ice cleared regularly. Use sand or salt on walking paths during winter. Clean up any spills in your garage right away. This includes oil or grease spills. What can I do in the bathroom? Use night lights. Install grab bars by the toilet and in the tub and shower. Do not use towel bars as grab bars. Use non-skid mats or decals in the tub or shower. If you need to sit down in the shower, use a plastic, non-slip stool. Keep the floor dry. Clean up any water that spills on the floor as soon as it happens. Remove soap buildup in the tub or shower regularly. Attach bath mats securely with double-sided non-slip rug tape. Do not have throw rugs and other things on the floor that can make you trip. What can I do in the bedroom? Use night lights. Make sure that you have a light by your bed that is easy to reach. Do not use any sheets or blankets that are too big for your bed.  They should not hang down onto the floor. Have a firm chair that has side arms. You can use this for support while you get dressed. Do not have throw rugs and other things on the floor that can make you trip. What can I do in the kitchen? Clean up any spills right away. Avoid walking on wet floors. Keep items that you use a lot in easy-to-reach places. If you need to reach something above you, use a strong step stool that has a grab bar. Keep electrical cords out of the way. Do not use floor polish or wax that makes floors slippery. If you must use wax, use non-skid floor wax. Do not have throw rugs and other things on the floor that can make you trip. What can I do with my stairs? Do not leave any items on the stairs. Make sure that there are handrails on both sides of the stairs and use them. Fix handrails that are broken or loose. Make sure that handrails are as long as the stairways. Check any carpeting to make sure that it is firmly attached to the stairs. Fix any carpet that is loose or worn. Avoid having throw rugs at the top or bottom of the stairs. If you do have throw rugs, attach them to the floor with carpet tape. Make sure that you have a light switch at  the top of the stairs and the bottom of the stairs. If you do not have them, ask someone to add them for you. What else can I do to help prevent falls? Wear shoes that: Do not have high heels. Have rubber bottoms. Are comfortable and fit you well. Are closed at the toe. Do not wear sandals. If you use a stepladder: Make sure that it is fully opened. Do not climb a closed stepladder. Make sure that both sides of the stepladder are locked into place. Ask someone to hold it for you, if possible. Clearly mark and make sure that you can see: Any grab bars or handrails. First and last steps. Where the edge of each step is. Use tools that help you move around (mobility aids) if they are needed. These  include: Canes. Walkers. Scooters. Crutches. Turn on the lights when you go into a dark area. Replace any light bulbs as soon as they burn out. Set up your furniture so you have a clear path. Avoid moving your furniture around. If any of your floors are uneven, fix them. If there are any pets around you, be aware of where they are. Review your medicines with your doctor. Some medicines can make you feel dizzy. This can increase your chance of falling. Ask your doctor what other things that you can do to help prevent falls. This information is not intended to replace advice given to you by your health care provider. Make sure you discuss any questions you have with your health care provider. Document Released: 06/21/2009 Document Revised: 01/31/2016 Document Reviewed: 09/29/2014 Elsevier Interactive Patient Education  2017 [employer].

## 2022-06-17 ENCOUNTER — Ambulatory Visit (INDEPENDENT_AMBULATORY_CARE_PROVIDER_SITE_OTHER): Payer: PPO

## 2022-06-17 DIAGNOSIS — Z23 Encounter for immunization: Secondary | ICD-10-CM | POA: Diagnosis not present

## 2022-06-17 NOTE — Progress Notes (Signed)
Per orders of Dr. Ethelene Hal pt is here for Influenza vaccine high dose. Pt received influenza vaccine in Left deltoid at 2:55 pm . Pt tolerated influenza vaccine well.   Per orders of Dr. Ethelene Hal pt is here for 2nd dose of Shingles vaccine. Pt received 2nd dose shingles vaccine in right deltoid at 2:55 pm. Given by [place] L. CMA/CPT, Pt tolerated 2nd dose shingles vaccine well.  Dose 2/2. Series completed.

## 2022-07-05 ENCOUNTER — Other Ambulatory Visit: Payer: Self-pay | Admitting: Family Medicine

## 2022-07-05 DIAGNOSIS — I10 Essential (primary) hypertension: Secondary | ICD-10-CM

## 2022-11-22 IMAGING — US US SCROTUM W/ DOPPLER COMPLETE
2 series · 13 of 25 positions shown · non-contrast
Comparison: None Available.

CLINICAL DATA: stable right testicular mass. Cyst? orchitis on
left.

EXAM:
SCROTAL ULTRASOUND
DOPPLER ULTRASOUND OF THE TESTICLES
TECHNIQUE: Complete ultrasound examination of the testicles, epididymis, and
other scrotal structures was performed. Color and spectral Doppler
ultrasound were also utilized to evaluate blood flow to the
testicles.

[Series 1: us scrotum w/ doppler complete · 12 of 74 slices shown (1 of 2)]
[im 1/74]
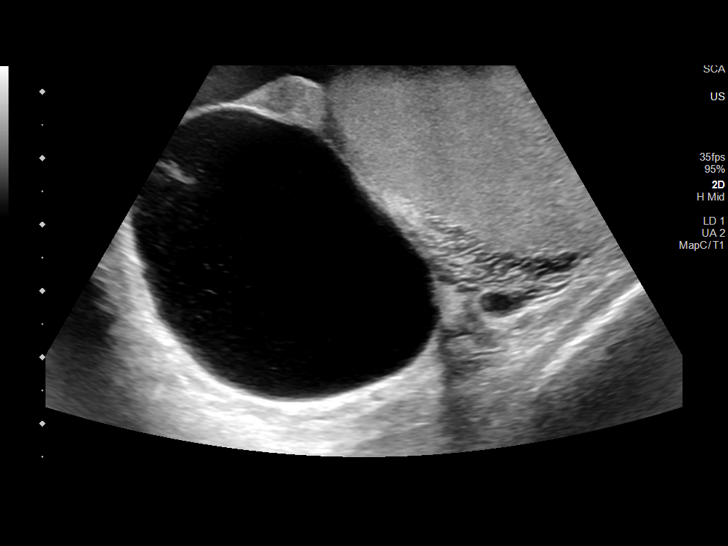
[im 7/74]
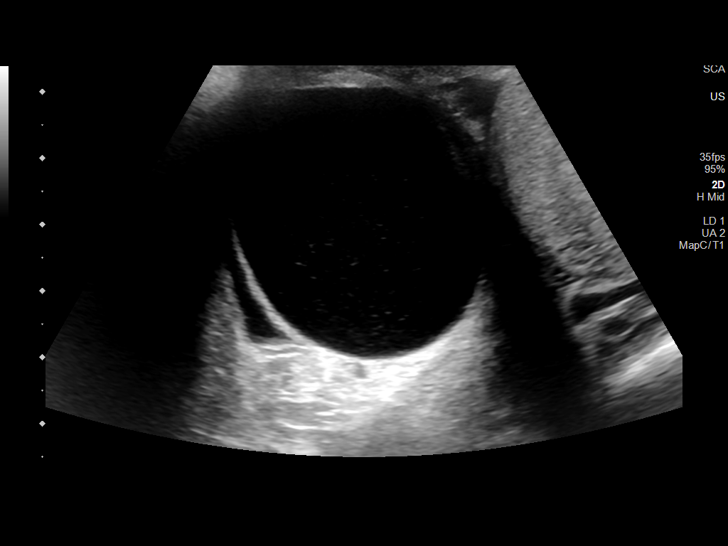
[im 13/74]
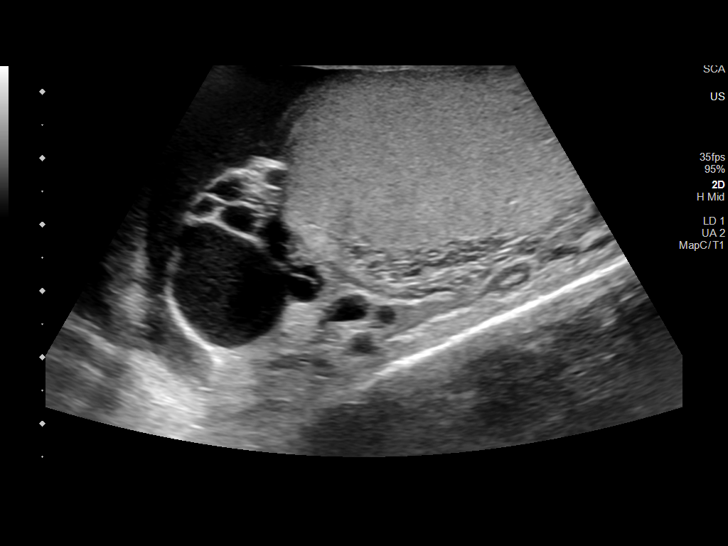
[im 20/74]
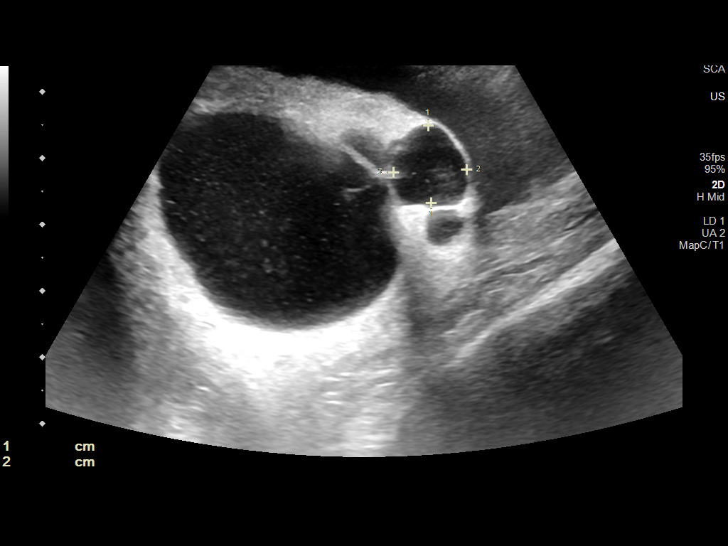
[im 26/74]
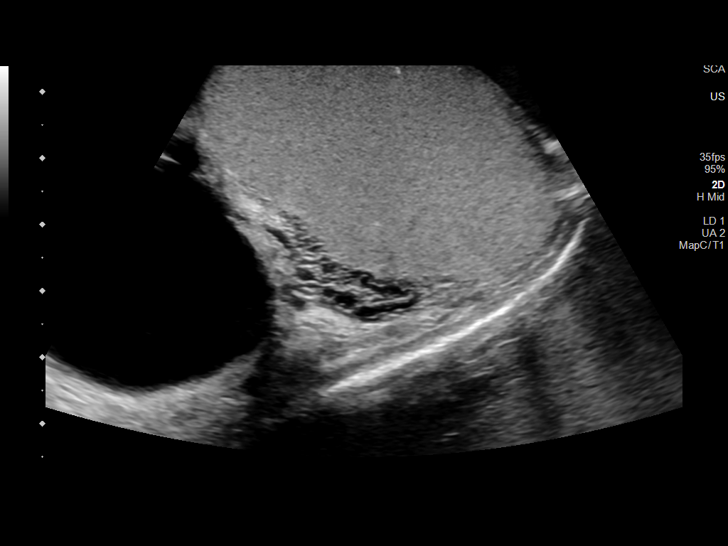
[im 32/74]
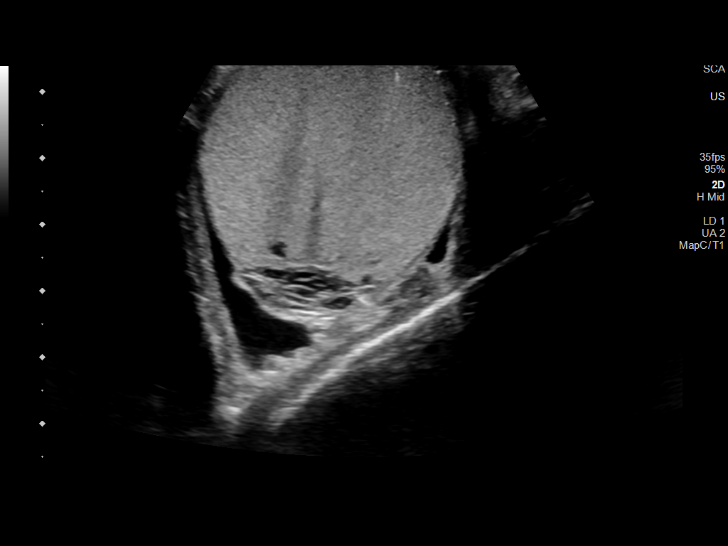
[im 39/74]
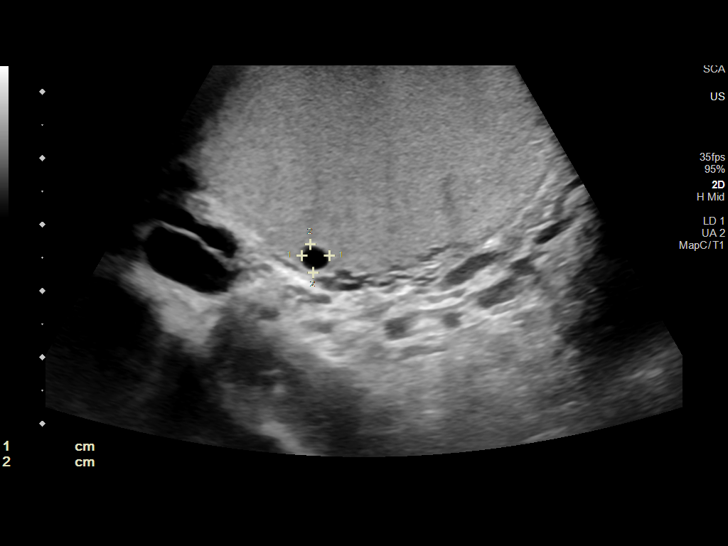
[im 45/74]
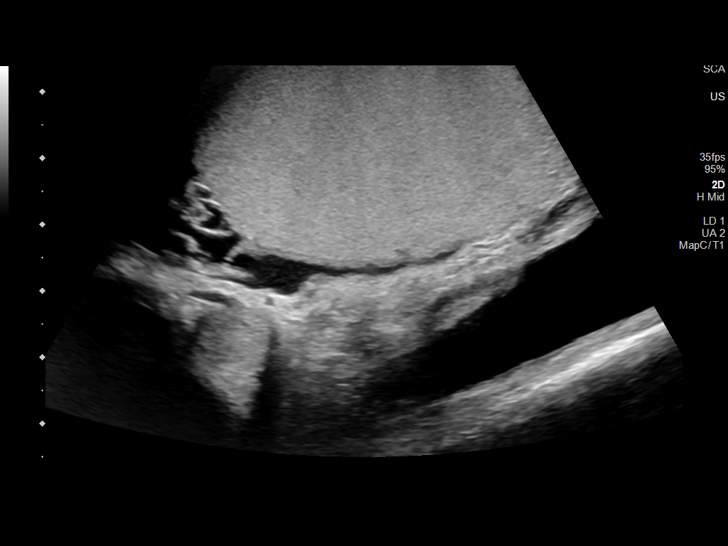
[im 51/74]
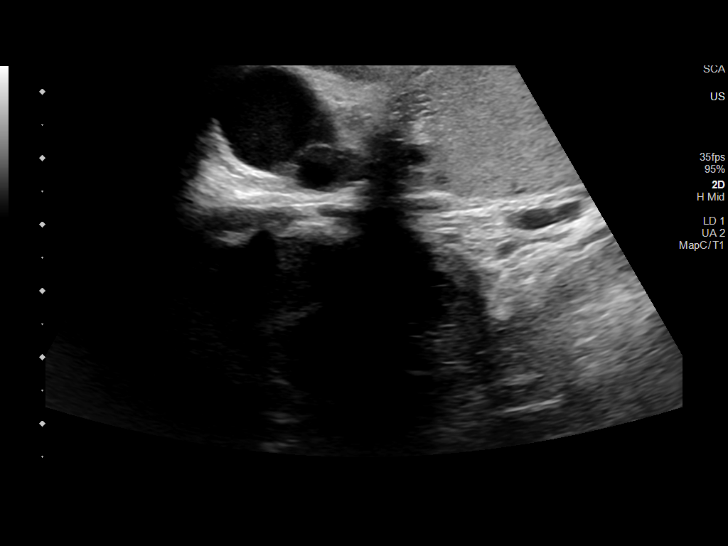
[im 58/74]
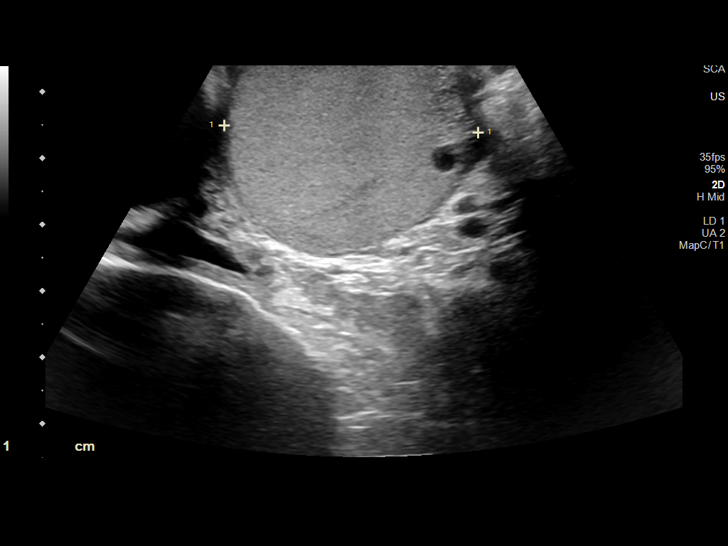
[im 64/74]
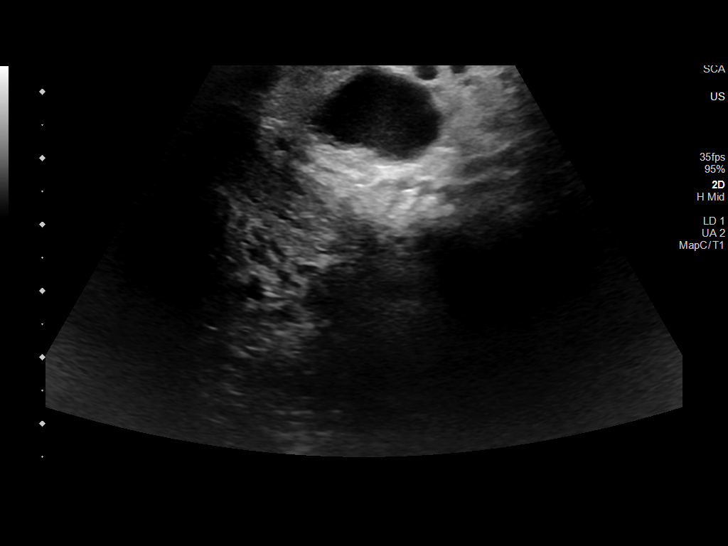
[im 70/74]
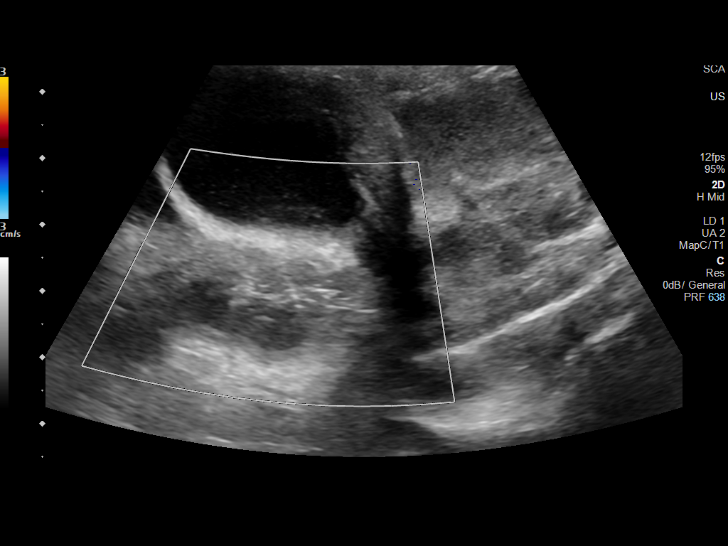

[Series 2: us scrotum w/ doppler complete · 1 of 3 slices shown (2 of 2)]
[im 1/3]
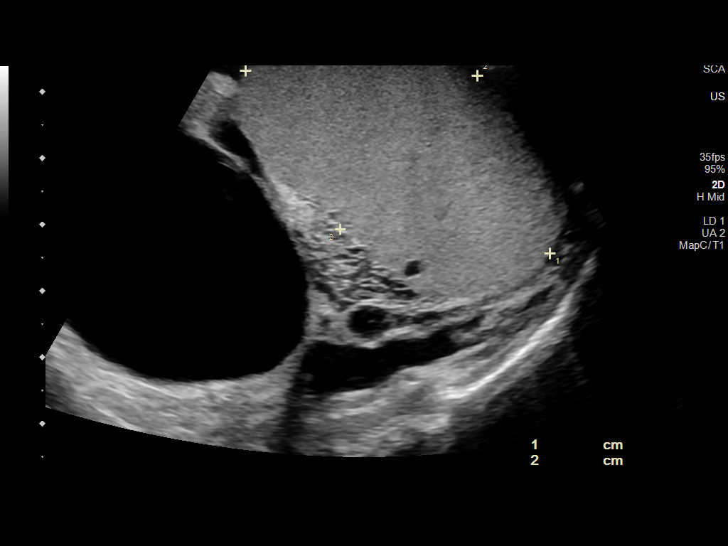

[13 of 25 positions shown; findings below may reference images not displayed]

FINDINGS: Right testicle

Measurements: 5.4 x 3 x 3.9 cm. No mass or microlithiasis
visualized. Small 3 x 3 x 3 mm cyst seen at the lower aspect of the
right testis.

Left testicle

Measurements: 5.8 x 3.4 x 3.8 cm. There is 4 x 4 x 4 mm cyst seen at
the mid region of the left testis. No mass or microlithiasis
visualized.

Right epididymis: There are multiple cysts seen at the head and tail
of the epididymis with the largest cyst at the head of the
epididymis measuring 4.9 x 3.6 x 3.7 cm

Left epididymis: There are multiple cysts seen at the head and tail
with the largest cyst at the head measuring 1.5 x 1.3 x 1.9 cm.

Hydrocele:  There is moderate right and mild left hydrocele seen.

Varicocele:  None visualized.

Pulsed Doppler interrogation of both testes demonstrates normal low
resistance arterial and venous waveforms bilaterally.
IMPRESSION: Both the testes have a normal appearance.

There are bilateral epididymal cysts seen at the head and tail. The
largest cyst at the head of the right epididymis measures 4.9 x
x 3.7 cm. The largest cyst at the head of the left epididymis
measures 1.5 x 1.3 x 1.9 cm.

There is moderate right and mild left hydrocele seen.

## 2023-01-15 ENCOUNTER — Other Ambulatory Visit: Payer: Self-pay | Admitting: Family Medicine

## 2023-01-15 DIAGNOSIS — I10 Essential (primary) hypertension: Secondary | ICD-10-CM

## 2023-01-15 DIAGNOSIS — E7800 Pure hypercholesterolemia, unspecified: Secondary | ICD-10-CM

## 2023-03-17 ENCOUNTER — Encounter: Payer: PPO | Admitting: Family Medicine

## 2023-03-20 ENCOUNTER — Ambulatory Visit (INDEPENDENT_AMBULATORY_CARE_PROVIDER_SITE_OTHER): Payer: PPO

## 2023-03-20 VITALS — Ht 71.0 in | Wt 175.0 lb

## 2023-03-20 DIAGNOSIS — Z0000 Encounter for general adult medical examination without abnormal findings: Secondary | ICD-10-CM

## 2023-03-20 NOTE — Patient Instructions (Signed)
[name] , Thank you for taking time to come for your Medicare Wellness Visit. I appreciate your ongoing commitment to your health goals. Please review the following plan we discussed and let me know if I can assist you in the future.   These are the goals we discussed:  Goals      Patient Stated     03/20/2023, wants to walk little bit more        This is a list of the screening recommended for you and due dates:  Health Maintenance  Topic Date Due   Pneumonia Vaccine (2 of 2 - PCV) 09/21/2018   Flu Shot  04/09/2023   Medicare Annual Wellness Visit  03/19/2024   Colon Cancer Screening  01/29/2025   DTaP/Tdap/Td vaccine (2 - Td or Tdap) 12/10/2031   Hepatitis C Screening  Completed   Zoster (Shingles) Vaccine  Completed   HPV Vaccine  Aged Out   COVID-19 Vaccine  Discontinued    Advanced directives: Please bring a copy of your POA (Power of [place]) and/or Living Will to your next appointment.   Conditions/risks identified: none  Next appointment: Follow up in one year for your annual wellness visit.   Preventive Care 68 Years and Older, Male  Preventive care refers to lifestyle choices and visits with your health care provider that can promote health and wellness. What does preventive care include? A yearly physical exam. This is also called an annual well check. Dental exams once or twice a year. Routine eye exams. Ask your health care provider how often you should have your eyes checked. Personal lifestyle choices, including: Daily care of your teeth and gums. Regular physical activity. Eating a healthy diet. Avoiding tobacco and drug use. Limiting alcohol use. Practicing safe sex. Taking low doses of aspirin every day. Taking vitamin and mineral supplements as recommended by your health care provider. What happens during an annual well check? The services and screenings done by your health care provider during your annual well check will depend on your age,  overall health, lifestyle risk factors, and family history of disease. Counseling  Your health care provider may ask you questions about your: Alcohol use. Tobacco use. Drug use. Emotional well-being. Home and relationship well-being. Sexual activity. Eating habits. History of falls. Memory and ability to understand (cognition). Work and work Astronomer. Screening  You may have the following tests or measurements: Height, weight, and BMI. Blood pressure. Lipid and cholesterol levels. These may be checked every 5 years, or more frequently if you are over 19 years old. Skin check. Lung cancer screening. You may have this screening every year starting at age 67 if you have a 30-pack-year history of smoking and currently smoke or have quit within the past 15 years. Fecal occult blood test (FOBT) of the stool. You may have this test every year starting at age 72. Flexible sigmoidoscopy or colonoscopy. You may have a sigmoidoscopy every 5 years or a colonoscopy every 10 years starting at age 39. Prostate cancer screening. Recommendations will vary depending on your family history and other risks. Hepatitis C blood test. Hepatitis B blood test. Sexually transmitted disease (STD) testing. Diabetes screening. This is done by checking your blood sugar (glucose) after you have not eaten for a while (fasting). You may have this done every 1-3 years. Abdominal aortic aneurysm (AAA) screening. You may need this if you are a current or former smoker. Osteoporosis. You may be screened starting at age 91 if you are at  high risk. Talk with your health care provider about your test results, treatment options, and if necessary, the need for more tests. Vaccines  Your health care provider may recommend certain vaccines, such as: Influenza vaccine. This is recommended every year. Tetanus, diphtheria, and acellular pertussis (Tdap, Td) vaccine. You may need a Td booster every 10 years. Zoster vaccine.  You may need this after age 1. Pneumococcal 13-valent conjugate (PCV13) vaccine. One dose is recommended after age 64. Pneumococcal polysaccharide (PPSV23) vaccine. One dose is recommended after age 87. Talk to your health care provider about which screenings and vaccines you need and how often you need them. This information is not intended to replace advice given to you by your health care provider. Make sure you discuss any questions you have with your health care provider. Document Released: 09/21/2015 Document Revised: 05/14/2016 Document Reviewed: 06/26/2015 Elsevier Interactive Patient Education  2017 [employer].  Fall Prevention in the Home Falls can cause injuries. They can happen to people of all ages. There are many things you can do to make your home safe and to help prevent falls. What can I do on the outside of my home? Regularly fix the edges of walkways and driveways and fix any cracks. Remove anything that might make you trip as you walk through a door, such as a raised step or threshold. Trim any bushes or trees on the path to your home. Use bright outdoor lighting. Clear any walking paths of anything that might make someone trip, such as rocks or tools. Regularly check to see if handrails are loose or broken. Make sure that both sides of any steps have handrails. Any raised decks and porches should have guardrails on the edges. Have any leaves, snow, or ice cleared regularly. Use sand or salt on walking paths during winter. Clean up any spills in your garage right away. This includes oil or grease spills. What can I do in the bathroom? Use night lights. Install grab bars by the toilet and in the tub and shower. Do not use towel bars as grab bars. Use non-skid mats or decals in the tub or shower. If you need to sit down in the shower, use a plastic, non-slip stool. Keep the floor dry. Clean up any water that spills on the floor as soon as it happens. Remove soap  buildup in the tub or shower regularly. Attach bath mats securely with double-sided non-slip rug tape. Do not have throw rugs and other things on the floor that can make you trip. What can I do in the bedroom? Use night lights. Make sure that you have a light by your bed that is easy to reach. Do not use any sheets or blankets that are too big for your bed. They should not hang down onto the floor. Have a firm chair that has side arms. You can use this for support while you get dressed. Do not have throw rugs and other things on the floor that can make you trip. What can I do in the kitchen? Clean up any spills right away. Avoid walking on wet floors. Keep items that you use a lot in easy-to-reach places. If you need to reach something above you, use a strong step stool that has a grab bar. Keep electrical cords out of the way. Do not use floor polish or wax that makes floors slippery. If you must use wax, use non-skid floor wax. Do not have throw rugs and other things on the floor that  can make you trip. What can I do with my stairs? Do not leave any items on the stairs. Make sure that there are handrails on both sides of the stairs and use them. Fix handrails that are broken or loose. Make sure that handrails are as long as the stairways. Check any carpeting to make sure that it is firmly attached to the stairs. Fix any carpet that is loose or worn. Avoid having throw rugs at the top or bottom of the stairs. If you do have throw rugs, attach them to the floor with carpet tape. Make sure that you have a light switch at the top of the stairs and the bottom of the stairs. If you do not have them, ask someone to add them for you. What else can I do to help prevent falls? Wear shoes that: Do not have high heels. Have rubber bottoms. Are comfortable and fit you well. Are closed at the toe. Do not wear sandals. If you use a stepladder: Make sure that it is fully opened. Do not climb a closed  stepladder. Make sure that both sides of the stepladder are locked into place. Ask someone to hold it for you, if possible. Clearly mark and make sure that you can see: Any grab bars or handrails. First and last steps. Where the edge of each step is. Use tools that help you move around (mobility aids) if they are needed. These include: Canes. Walkers. Scooters. Crutches. Turn on the lights when you go into a dark area. Replace any light bulbs as soon as they burn out. Set up your furniture so you have a clear path. Avoid moving your furniture around. If any of your floors are uneven, fix them. If there are any pets around you, be aware of where they are. Review your medicines with your doctor. Some medicines can make you feel dizzy. This can increase your chance of falling. Ask your doctor what other things that you can do to help prevent falls. This information is not intended to replace advice given to you by your health care provider. Make sure you discuss any questions you have with your health care provider. Document Released: 06/21/2009 Document Revised: 01/31/2016 Document Reviewed: 09/29/2014 Elsevier Interactive Patient Education  2017 [employer].

## 2023-03-20 NOTE — Progress Notes (Signed)
Subjective:   Kerry Bright is a 72 y.o. male who presents for Medicare Annual/Subsequent preventive examination.  Visit Complete: Virtual  I connected with  Jimmie Molly on 03/20/23 by a audio enabled telemedicine application and verified that I am speaking with the correct person using two identifiers.  Patient Location: Home  Provider Location: Office/Clinic  I discussed the limitations of evaluation and management by telemedicine. The patient expressed understanding and agreed to proceed.  Patient Medicare AWV questionnaire was completed by the patient on 03/20/2023; I have confirmed that all information answered by patient is correct and no changes since this date.  Review of Systems     Cardiac Risk Factors include: advanced age (>70men, >13 women);male gender     Objective:    Today's Vitals   03/20/23 0832  Weight: 175 lb (79.4 kg)  Height: 5\' 11"  (1.803 m)   Body mass index is 24.41 kg/m.     03/20/2023    8:36 AM 03/12/2022   11:24 AM 05/11/2019   12:20 PM 05/11/2019    6:49 AM 05/04/2019    8:15 AM  Advanced Directives  Does Patient Have a Medical Advance Directive? Yes Yes Yes Yes Yes  Type of Estate agent of [place];Living will Healthcare Power of [place];Living will Healthcare Power of [place];Living will Healthcare Power of [place];Living will Healthcare Power of [place];Living will  Does patient want to make changes to medical advance directive?   No - Patient declined No - Patient declined No - Patient declined  Copy of Healthcare Power of Attorney in Chart? No - copy requested No - copy requested No - copy requested No - copy requested     Current Medications (verified) Outpatient Encounter Medications as of 03/20/2023  Medication Sig   amLODipine (NORVASC) 5 MG tablet TAKE 1 TABLET(5 MG) BY MOUTH DAILY   atorvastatin (LIPITOR) 10 MG tablet TAKE 1 TABLET(10 MG) BY MOUTH DAILY   TURMERIC PO Take by mouth.   VITAMIN D PO Take by  mouth.   Multiple Vitamins-Minerals (MULTIVITAMIN ADULT) CHEW Chew by mouth. CENTRUM MEN'S MULTIVITAMIN GUMMIES (Patient not taking: Reported on 03/20/2023)   sildenafil (REVATIO) 20 MG tablet May take 3-5 45 minutes prior. No more than 5 daily (Patient not taking: Reported on 03/20/2023)   No facility-administered encounter medications on file as of 03/20/2023.    Allergies (verified) Patient has no known allergies.   History: Past Medical History:  Diagnosis Date   Allergy    SEASONAL   Arthritis    OA   Cancer (HCC)    SKIN CANCER- BASAL CELL PER PT   History of kidney stones    Hyperlipidemia    Hypertension    CONTROLLED   Past Surgical History:  Procedure Laterality Date   basal areas removed     ear top of head face   COLONOSCOPY     POLYPECTOMY  2013   HPP X 2   right foot lesion drained  2000   TONSILLECTOMY     TOTAL HIP ARTHROPLASTY Left 05/11/2019   Procedure: TOTAL HIP ARTHROPLASTY ANTERIOR APPROACH;  Surgeon: Samson Frederic, MD;  Location: [facility];  Service: Orthopedics;  Laterality: Left;   Family History  Problem Relation Age of Onset   Cancer Father    Cancer Daughter    Colon cancer Neg Hx    Colon polyps Neg Hx    Esophageal cancer Neg Hx    Rectal cancer Neg Hx    Stomach cancer Neg Hx  Social History   Socioeconomic History   Marital status: Married    Spouse name: Not on file   Number of children: Not on file   Years of education: Not on file   Highest education level: Bachelor's degree (e.g., BA, AB, BS)  Occupational History   Not on file  Tobacco Use   Smoking status: Former    Current packs/day: 1.50    Average packs/day: 1.5 packs/day for 6.0 years (9.0 ttl pk-yrs)    Types: Cigarettes   Smokeless tobacco: Former   Tobacco comments:    quit 1980  Vaping Use   Vaping status: Never Used  Substance and Sexual Activity   Alcohol use: Yes    Comment: 2-3 beers per day   Drug use: Never   Sexual activity: Not Currently  Other  Topics Concern   Not on file  Social History Narrative   Not on file   Social Determinants of Health   Financial Resource Strain: Low Risk  (03/20/2023)   Overall Financial Resource Strain (CARDIA)    Difficulty of Paying Living Expenses: Not hard at all  Food Insecurity: No Food Insecurity (03/20/2023)   Hunger Vital Sign    Worried About Running Out of Food in the Last Year: Never true    Ran Out of Food in the Last Year: Never true  Transportation Needs: No Transportation Needs (03/20/2023)   PRAPARE - Administrator, Civil Service (Medical): No    Lack of Transportation (Non-Medical): No  Physical Activity: Insufficiently Active (03/20/2023)   Exercise Vital Sign    Days of Exercise per Week: 3 days    Minutes of Exercise per Session: 40 min  Stress: No Stress Concern Present (03/20/2023)   [employer] of Occupational Health - Occupational Stress Questionnaire    Feeling of Stress : Not at all  Social Connections: Unknown (03/20/2023)   Social Connection and Isolation Panel [NHANES]    Frequency of Communication with Friends and Family: Once a week    Frequency of Social Gatherings with Friends and Family: Once a week    Attends Religious Services: Not on Marketing executive or Organizations: No    Attends Banker Meetings: Never    Marital Status: Married    Tobacco Counseling Counseling given: Not Answered Tobacco comments: quit 1980   Clinical Intake:  Pre-visit preparation completed: Yes  Pain : No/denies pain     Nutritional Status: BMI of 19-24  Normal Nutritional Risks: None Diabetes: No  How often do you need to have someone help you when you read instructions, pamphlets, or other written materials from your doctor or pharmacy?: 1 - Never  Interpreter Needed?: No  Information entered by :: NAllen LPN   Activities of Daily Living    03/20/2023    6:52 AM  In your present state of health, do you have any  difficulty performing the following activities:  Hearing? 0  Vision? 0  Difficulty concentrating or making decisions? 0  Walking or climbing stairs? 0  Dressing or bathing? 0  Doing errands, shopping? 0  Preparing Food and eating ? N  Using the Toilet? N  In the past six months, have you accidently leaked urine? N  Do you have problems with loss of bowel control? N  Managing your Medications? N  Managing your Finances? N  Housekeeping or managing your Housekeeping? N    Patient Care Team: Mliss Sax, MD as PCP -  General (Family Medicine)  Indicate any recent Medical Services you may have received from other than [facility] providers in the past year (date may be approximate).     Assessment:   This is a routine wellness examination for [facility].  Hearing/Vision screen Hearing Screening - Comments:: Denies any hearing issues Vision Screening - Comments:: Regular eye exams, in [place]  Dietary issues and exercise activities discussed:     Goals Addressed             This Visit's Progress    Patient Stated       03/20/2023, wants to walk little bit more       Depression Screen    03/20/2023    8:37 AM 03/12/2022   11:23 AM 03/12/2022   11:21 AM 12/09/2021    1:47 PM 12/09/2021    1:45 PM 10/03/2021    3:33 PM 09/10/2021    2:11 PM  PHQ 2/9 Scores  PHQ - 2 Score 0 0 0 0 0 0 0  PHQ- 9 Score 0      0    Fall Risk    03/20/2023    6:52 AM 03/12/2022   11:25 AM 12/09/2021    1:47 PM 12/09/2021    1:45 PM 10/03/2021    3:33 PM  Fall Risk   Falls in the past year? 0 0 0 0 0  Number falls in past yr: 0 0 0 0 0  Injury with Fall? 0 0     Risk for fall due to : History of fall(s) No Fall Risks     Follow up  Falls evaluation completed;Education provided       MEDICARE RISK AT HOME:   TIMED UP AND GO:  Was the test performed?  No    Cognitive Function:        03/20/2023    8:37 AM  6CIT Screen  What Year? 0 points  What month? 0 points  What time? 0  points  Count back from 20 0 points  Months in reverse 0 points  Repeat phrase 0 points  Total Score 0 points    Immunizations Immunization History  Administered Date(s) Administered   Fluad Quad(high Dose 65+) 07/01/2021, 06/17/2022   Influenza, High Dose Seasonal PF 09/21/2017   Influenza,inj,Quad PF,6+ Mos 10/05/2018   PFIZER(Purple Top)SARS-COV-2 Vaccination 12/22/2019, 01/12/2020, 12/21/2020   Pneumococcal Polysaccharide-23 09/21/2017   Tdap 12/09/2021   Zoster Recombinant(Shingrix) 12/09/2021, 06/17/2022    TDAP status: Up to date  Flu Vaccine status: Up to date  Pneumococcal vaccine status: Due, Education has been provided regarding the importance of this vaccine. Advised may receive this vaccine at local pharmacy or Health Dept. Aware to provide a copy of the vaccination record if obtained from local pharmacy or Health Dept. Verbalized acceptance and understanding.  Covid-19 vaccine status: Information provided on how to obtain vaccines.   Qualifies for Shingles Vaccine? Yes   Zostavax completed Yes   Shingrix Completed?: Yes  Screening Tests Health Maintenance  Topic Date Due   Pneumonia Vaccine 52+ Years old (2 of 2 - PCV) 09/21/2018   INFLUENZA VACCINE  04/09/2023   Medicare Annual Wellness (AWV)  03/19/2024   Colonoscopy  01/29/2025   DTaP/Tdap/Td (2 - Td or Tdap) 12/10/2031   Hepatitis C Screening  Completed   Zoster Vaccines- Shingrix  Completed   HPV VACCINES  Aged Out   COVID-19 Vaccine  Discontinued    Health Maintenance  Health Maintenance Due  Topic Date Due  Pneumonia Vaccine 87+ Years old (2 of 2 - PCV) 09/21/2018    Colorectal cancer screening: Type of screening: Colonoscopy. Completed 01/29/2022. Repeat every 10 years  Lung Cancer Screening: (Low Dose CT Chest recommended if Age 73-80 years, 20 pack-year currently smoking OR have quit w/in 15years.) does not qualify.   Lung Cancer Screening Referral: no  Additional  Screening:  Hepatitis C Screening: does qualify; Completed 09/22/2017  Vision Screening: Recommended annual ophthalmology exams for early detection of glaucoma and other disorders of the eye. Is the patient up to date with their annual eye exam?  Yes  Who is the provider or what is the name of the office in which the patient attends annual eye exams? In [place] If pt is not established with a provider, would they like to be referred to a provider to establish care? No .   Dental Screening: Recommended annual dental exams for proper oral hygiene  Diabetic Foot Exam: n/a  Community Resource Referral / Chronic Care Management: CRR required this visit?  No   CCM required this visit?  No     Plan:     I have personally reviewed and noted the following in the patient's chart:   Medical and social history Use of alcohol, tobacco or illicit drugs  Current medications and supplements including opioid prescriptions. Patient is not currently taking opioid prescriptions. Functional ability and status Nutritional status Physical activity Advanced directives List of other physicians Hospitalizations, surgeries, and ER visits in previous 12 months Vitals Screenings to include cognitive, depression, and falls Referrals and appointments  In addition, I have reviewed and discussed with patient certain preventive protocols, quality metrics, and best practice recommendations. A written personalized care plan for preventive services as well as general preventive health recommendations were provided to patient.     Barb Merino, LPN   1/61/0960   After Visit Summary: (MyChart) Due to this being a telephonic visit, the after visit summary with patients personalized plan was offered to patient via MyChart   Nurse Notes: none

## 2023-04-02 ENCOUNTER — Ambulatory Visit (INDEPENDENT_AMBULATORY_CARE_PROVIDER_SITE_OTHER): Payer: PPO | Admitting: Family Medicine

## 2023-04-02 ENCOUNTER — Encounter: Payer: Self-pay | Admitting: Family Medicine

## 2023-04-02 VITALS — BP 124/68 | HR 61 | Temp 97.1°F | Ht 71.0 in | Wt 171.8 lb

## 2023-04-02 DIAGNOSIS — Z125 Encounter for screening for malignant neoplasm of prostate: Secondary | ICD-10-CM | POA: Diagnosis not present

## 2023-04-02 DIAGNOSIS — Z23 Encounter for immunization: Secondary | ICD-10-CM

## 2023-04-02 DIAGNOSIS — Z0000 Encounter for general adult medical examination without abnormal findings: Secondary | ICD-10-CM | POA: Diagnosis not present

## 2023-04-02 DIAGNOSIS — I10 Essential (primary) hypertension: Secondary | ICD-10-CM | POA: Diagnosis not present

## 2023-04-02 DIAGNOSIS — E7800 Pure hypercholesterolemia, unspecified: Secondary | ICD-10-CM | POA: Diagnosis not present

## 2023-04-02 LAB — COMPREHENSIVE METABOLIC PANEL
ALT: 16 U/L (ref 0–53)
AST: 19 U/L (ref 0–37)
Albumin: 4.4 g/dL (ref 3.5–5.2)
Alkaline Phosphatase: 48 U/L (ref 39–117)
BUN: 17 mg/dL (ref 6–23)
CO2: 25 mEq/L (ref 19–32)
Calcium: 9.5 mg/dL (ref 8.4–10.5)
Chloride: 105 mEq/L (ref 96–112)
Creatinine, Ser: 0.79 mg/dL (ref 0.40–1.50)
GFR: 88.66 mL/min (ref >60.00)
Glucose, Bld: 104 mg/dL — ABNORMAL HIGH (ref 70–99)
Potassium: 4.4 mEq/L (ref 3.5–5.1)
Sodium: 137 mEq/L (ref 135–145)
Total Bilirubin: 1.4 mg/dL — ABNORMAL HIGH (ref 0.2–1.2)
Total Protein: 6.4 g/dL (ref 6.0–8.3)

## 2023-04-02 LAB — CBC
HCT: 43.7 % (ref 39.0–52.0)
Hemoglobin: 14.5 g/dL (ref 13.0–17.0)
MCHC: 33.1 g/dL (ref 30.0–36.0)
MCV: 89 fl (ref 78.0–100.0)
Platelets: 251 10*3/uL (ref 150.0–400.0)
RBC: 4.91 Mil/uL (ref 4.22–5.81)
RDW: 13.9 % (ref 11.5–15.5)
WBC: 6.9 10*3/uL (ref 4.0–10.5)

## 2023-04-02 LAB — URINALYSIS, ROUTINE W REFLEX MICROSCOPIC
Bilirubin Urine: NEGATIVE
Hgb urine dipstick: NEGATIVE
Ketones, ur: NEGATIVE
Leukocytes,Ua: NEGATIVE
Nitrite: NEGATIVE
RBC / HPF: NONE SEEN (ref 0–?)
Specific Gravity, Urine: 1.01 (ref 1.000–1.030)
Total Protein, Urine: NEGATIVE
Urine Glucose: NEGATIVE
Urobilinogen, UA: 0.2 (ref 0.0–1.0)
WBC, UA: NONE SEEN (ref 0–?)
pH: 6.5 (ref 5.0–8.0)

## 2023-04-02 LAB — PSA: PSA: 2.2 ng/mL (ref 0.10–4.00)

## 2023-04-02 LAB — LDL CHOLESTEROL, DIRECT: Direct LDL: 68 mg/dL

## 2023-04-02 NOTE — Progress Notes (Signed)
Established Patient Office Visit   Subjective:  Patient ID: Kerry Bright, male    DOB: 1951/02/17  Age: 72 y.o. MRN: [id number]  Chief Complaint  Patient presents with   Medical Management of Chronic Issues    3 month follow up. Pt is doing well.     HPI Encounter Diagnoses  Name Primary?   Essential hypertension Yes   Elevated LDL cholesterol level    Screening for prostate cancer    Immunization due    Healthcare maintenance    For follow-up of above health check.  Doing well without complaints.  Continues on medications as above.  Is active around the house and has regular dental care.  In the process of moving towards [place].   Review of Systems  Constitutional: Negative.   HENT: Negative.    Eyes:  Negative for blurred vision, discharge and redness.  Respiratory: Negative.    Cardiovascular: Negative.   Gastrointestinal:  Negative for abdominal pain.  Genitourinary: Negative.   Musculoskeletal: Negative.  Negative for myalgias.  Skin:  Negative for rash.  Neurological:  Negative for tingling, loss of consciousness and weakness.  Endo/Heme/Allergies:  Negative for polydipsia.     Current Outpatient Medications:    amLODipine (NORVASC) 5 MG tablet, TAKE 1 TABLET(5 MG) BY MOUTH DAILY, Disp: 90 tablet, Rfl: 1   atorvastatin (LIPITOR) 10 MG tablet, TAKE 1 TABLET(10 MG) BY MOUTH DAILY, Disp: 90 tablet, Rfl: 3   Multiple Vitamins-Minerals (MULTIVITAMIN ADULT) CHEW, Chew by mouth. CENTRUM MEN'S MULTIVITAMIN GUMMIES, Disp: , Rfl:    sildenafil (REVATIO) 20 MG tablet, May take 3-5 45 minutes prior. No more than 5 daily, Disp: 50 tablet, Rfl: 1   TURMERIC PO, Take by mouth., Disp: , Rfl:    VITAMIN D PO, Take by mouth., Disp: , Rfl:    Objective:     BP 124/68   Pulse 61   Temp (!) 97.1 F (36.2 C)   Ht 5\' 11"  (1.803 m)   Wt 171 lb 12.8 oz (77.9 kg)   SpO2 96%   BMI 23.96 kg/m    Physical Exam Constitutional:      General: He is not in acute  distress.    Appearance: Normal appearance. He is not ill-appearing, toxic-appearing or diaphoretic.  HENT:     Head: Normocephalic and atraumatic.     Right Ear: Tympanic membrane, ear canal and external ear normal.     Left Ear: Tympanic membrane, ear canal and external ear normal.     Mouth/Throat:     Mouth: Mucous membranes are moist.     Pharynx: Oropharynx is clear. No oropharyngeal exudate or posterior oropharyngeal erythema.  Eyes:     General: No scleral icterus.       Right eye: No discharge.        Left eye: No discharge.     Extraocular Movements: Extraocular movements intact.     Conjunctiva/sclera: Conjunctivae normal.     Pupils: Pupils are equal, round, and reactive to light.  Cardiovascular:     Rate and Rhythm: Normal rate and regular rhythm.  Pulmonary:     Effort: Pulmonary effort is normal. No respiratory distress.     Breath sounds: Normal breath sounds.  Abdominal:     General: Bowel sounds are normal.  Musculoskeletal:     Cervical back: No rigidity or tenderness.  Lymphadenopathy:     Cervical: No cervical adenopathy.  Skin:    General: Skin is warm and dry.  Neurological:  Mental Status: He is alert and oriented to person, place, and time.  Psychiatric:        Mood and Affect: Mood normal.        Behavior: Behavior normal.        04/02/2023    1:20 PM 03/20/2023    8:37 AM 03/12/2022   11:23 AM  Depression screen PHQ 2/9  Decreased Interest 0 0 0  Down, Depressed, Hopeless 0 0 0  PHQ - 2 Score 0 0 0  Altered sleeping  0   Tired, decreased energy  0   Change in appetite  0   Feeling bad or failure about yourself   0   Trouble concentrating  0   Moving slowly or fidgety/restless  0   Suicidal thoughts  0   PHQ-9 Score  0   Difficult doing work/chores  Not difficult at all      No results found for any visits on 04/02/23.    The 10-year ASCVD risk score (Arnett DK, et al., 2019) is: 19.5%    Assessment & Plan:   Essential  hypertension -     CBC -     Comprehensive metabolic panel -     Urinalysis, Routine w reflex microscopic -     PSA  Elevated LDL cholesterol level -     Comprehensive metabolic panel -     LDL cholesterol, direct  Screening for prostate cancer -     PSA  Immunization due -     Pneumococcal conjugate vaccine 20-valent  Healthcare maintenance    Return in about 6 months (around 10/03/2023).  Information was given on health maintenance and disease prevention.  Prevnar 20 today.  Continue all medications as above.  Adjustments made pending results of labs.  Mliss Sax, MD

## 2023-06-23 ENCOUNTER — Encounter: Payer: Self-pay | Admitting: Family Medicine

## 2023-06-23 ENCOUNTER — Ambulatory Visit (INDEPENDENT_AMBULATORY_CARE_PROVIDER_SITE_OTHER): Payer: PPO | Admitting: Family Medicine

## 2023-06-23 VITALS — BP 120/62 | HR 59 | Temp 98.2°F | Ht 71.0 in | Wt 169.2 lb

## 2023-06-23 DIAGNOSIS — R1033 Periumbilical pain: Secondary | ICD-10-CM

## 2023-06-23 DIAGNOSIS — N2889 Other specified disorders of kidney and ureter: Secondary | ICD-10-CM | POA: Diagnosis not present

## 2023-06-23 DIAGNOSIS — R634 Abnormal weight loss: Secondary | ICD-10-CM | POA: Diagnosis not present

## 2023-06-23 DIAGNOSIS — Z23 Encounter for immunization: Secondary | ICD-10-CM | POA: Diagnosis not present

## 2023-06-23 NOTE — Progress Notes (Addendum)
Established Patient Office Visit   Subjective:  Patient ID: Kerry Bright, male    DOB: March 09, 1951  Age: 72 y.o. MRN: [id number]  Chief Complaint  Patient presents with   Abdominal Pain    Pt c/o midline abd pain that has been going on x3 weeks.    Abdominal Pain Associated symptoms include weight loss. Pertinent negatives include no constipation, diarrhea, melena, myalgias, nausea or vomiting.   Encounter Diagnoses  Name Primary?   Need for influenza vaccination Yes   Periumbilical abdominal pain    Weight loss, unintentional    Renal mass, left    Other specified disorders of kidney and ureter    3-week history of intermittent mid abdominal pain.  There was one at episode agitation.  Otherwise, there has been no nausea or vomiting.  Denies reflux or burning into the chest.  No chest pain or shortness of breath.  No difficulty with urination.  Denies constipation or difficulty with stooling.  No melena or hematochezia.  Weight loss of 6 pounds.  Enjoys 2-3 beers daily.  Has not smoked for 40 years.  Colonoscopy in 20 23 showed diverticulosis   Review of Systems  Constitutional:  Positive for weight loss. Negative for malaise/fatigue.  HENT: Negative.    Eyes:  Negative for blurred vision, discharge and redness.  Respiratory: Negative.    Cardiovascular: Negative.   Gastrointestinal:  Positive for abdominal pain. Negative for blood in stool, constipation, diarrhea, melena, nausea and vomiting.  Genitourinary: Negative.   Musculoskeletal: Negative.  Negative for myalgias.  Skin:  Negative for rash.  Neurological:  Negative for tingling, loss of consciousness and weakness.  Endo/Heme/Allergies:  Negative for polydipsia.     Current Outpatient Medications:    amLODipine (NORVASC) 5 MG tablet, TAKE 1 TABLET(5 MG) BY MOUTH DAILY, Disp: 90 tablet, Rfl: 1   atorvastatin (LIPITOR) 10 MG tablet, TAKE 1 TABLET(10 MG) BY MOUTH DAILY, Disp: 90 tablet, Rfl: 3   Multiple  Vitamins-Minerals (MULTIVITAMIN ADULT) CHEW, Chew by mouth. CENTRUM MEN'S MULTIVITAMIN GUMMIES, Disp: , Rfl:    sildenafil (REVATIO) 20 MG tablet, May take 3-5 45 minutes prior. No more than 5 daily, Disp: 50 tablet, Rfl: 1   TURMERIC PO, Take by mouth., Disp: , Rfl:    VITAMIN D PO, Take by mouth., Disp: , Rfl:    Objective:     BP 120/62   Pulse (!) 59   Temp 98.2 F (36.8 C)   Ht 5\' 11"  (1.803 m)   Wt 169 lb 3.2 oz (76.7 kg)   SpO2 95%   BMI 23.60 kg/m  Wt Readings from Last 3 Encounters:  06/23/23 169 lb 3.2 oz (76.7 kg)  04/02/23 171 lb 12.8 oz (77.9 kg)  03/20/23 175 lb (79.4 kg)      Physical Exam Constitutional:      General: He is not in acute distress.    Appearance: Normal appearance. He is not ill-appearing, toxic-appearing or diaphoretic.  HENT:     Head: Normocephalic and atraumatic.     Right Ear: External ear normal.     Left Ear: External ear normal.     Mouth/Throat:     Mouth: Mucous membranes are moist.     Pharynx: Oropharynx is clear. No oropharyngeal exudate or posterior oropharyngeal erythema.  Eyes:     General: No scleral icterus.       Right eye: No discharge.        Left eye: No discharge.     Extraocular  Movements: Extraocular movements intact.     Conjunctiva/sclera: Conjunctivae normal.     Pupils: Pupils are equal, round, and reactive to light.  Cardiovascular:     Rate and Rhythm: Normal rate and regular rhythm.     Heart sounds: Murmur heard.  Pulmonary:     Effort: Pulmonary effort is normal. No respiratory distress.     Breath sounds: Normal breath sounds. No wheezing, rhonchi or rales.  Abdominal:     General: Abdomen is flat. Bowel sounds are normal. There is no distension or abdominal bruit. There are no signs of injury.     Palpations: Abdomen is soft. There is no hepatomegaly or mass.     Tenderness: There is no abdominal tenderness. There is no guarding.     Hernia: There is no hernia in the umbilical area or ventral area.   Musculoskeletal:     Cervical back: No rigidity or tenderness.  Skin:    General: Skin is warm and dry.  Neurological:     Mental Status: He is alert and oriented to person, place, and time.  Psychiatric:        Mood and Affect: Mood normal.        Behavior: Behavior normal.      Results for orders placed or performed in visit on 06/23/23  CBC  Result Value Ref Range   WBC 5.4 4.0 - 10.5 K/uL   RBC 4.92 4.22 - 5.81 Mil/uL   Platelets 240.0 150.0 - 400.0 K/uL   Hemoglobin 14.5 13.0 - 17.0 g/dL   HCT 09.8 11.9 - 14.7 %   MCV 89.8 78.0 - 100.0 fl   MCHC 32.9 30.0 - 36.0 g/dL   RDW 82.9 56.2 - 13.0 %  Basic metabolic panel  Result Value Ref Range   Sodium 141 135 - 145 mEq/L   Potassium 4.0 3.5 - 5.1 mEq/L   Chloride 106 96 - 112 mEq/L   CO2 26 19 - 32 mEq/L   Glucose, Bld 119 (H) 70 - 99 mg/dL   BUN 16 6 - 23 mg/dL   Creatinine, Ser 8.65 0.40 - 1.50 mg/dL   GFR 78.46 >96.29 mL/min   Calcium 9.5 8.4 - 10.5 mg/dL  Hepatic function panel  Result Value Ref Range   Total Bilirubin 1.1 0.2 - 1.2 mg/dL   Bilirubin, Direct 0.2 0.0 - 0.3 mg/dL   Alkaline Phosphatase 49 39 - 117 U/L   AST 17 0 - 37 U/L   ALT 14 0 - 53 U/L   Total Protein 6.3 6.0 - 8.3 g/dL   Albumin 4.2 3.5 - 5.2 g/dL  Amylase  Result Value Ref Range   Amylase 54 27 - 131 U/L  Lipase  Result Value Ref Range   Lipase 62.0 (H) 11.0 - 59.0 U/L  Urinalysis, Routine w reflex microscopic  Result Value Ref Range   Color, Urine YELLOW Yellow;Lt. Yellow;Straw;Dark Yellow;Amber;Green;Red;Brown   APPearance Sl Cloudy (A) Clear;Turbid;Slightly Cloudy;Cloudy   Specific Gravity, Urine 1.025 1.000 - 1.030   pH 5.5 5.0 - 8.0   Total Protein, Urine NEGATIVE Negative   Urine Glucose NEGATIVE Negative   Ketones, ur NEGATIVE Negative   Bilirubin Urine NEGATIVE Negative   Hgb urine dipstick TRACE-INTACT (A) Negative   Urobilinogen, UA 0.2 0.0 - 1.0   Leukocytes,Ua NEGATIVE Negative   Nitrite NEGATIVE Negative   WBC,  UA none seen 0-2/hpf   RBC / HPF 0-2/hpf 0-2/hpf   Mucus, UA Presence of (A) None   Ca Oxalate  Crys, UA Presence of (A) None   Amorphous Present (A) None;Present      The 10-year ASCVD risk score (Arnett DK, et al., 2019) is: 18.5%    Assessment & Plan:   Need for influenza vaccination -     Flu Vaccine Trivalent High Dose (Fluad)  Periumbilical abdominal pain -     CBC -     Basic metabolic panel -     Hepatic function panel -     Amylase -     Lipase -     Urinalysis, Routine w reflex microscopic  Weight loss, unintentional  Renal mass, left -     MR ABDOMEN W WO CONTRAST; Future  Other specified disorders of kidney and ureter -     MR ABDOMEN W WO CONTRAST; Future    Return in about 4 weeks (around 07/21/2023), or if symptoms worsen or fail to improve.    Mliss Sax, MD

## 2023-06-24 LAB — URINALYSIS, ROUTINE W REFLEX MICROSCOPIC
Bilirubin Urine: NEGATIVE
Ketones, ur: NEGATIVE
Leukocytes,Ua: NEGATIVE
Nitrite: NEGATIVE
Specific Gravity, Urine: 1.025 (ref 1.000–1.030)
Total Protein, Urine: NEGATIVE
Urine Glucose: NEGATIVE
Urobilinogen, UA: 0.2 (ref 0.0–1.0)
WBC, UA: NONE SEEN (ref 0–?)
pH: 5.5 (ref 5.0–8.0)

## 2023-06-24 LAB — HEPATIC FUNCTION PANEL
ALT: 14 U/L (ref 0–53)
AST: 17 U/L (ref 0–37)
Albumin: 4.2 g/dL (ref 3.5–5.2)
Alkaline Phosphatase: 49 U/L (ref 39–117)
Bilirubin, Direct: 0.2 mg/dL (ref 0.0–0.3)
Total Bilirubin: 1.1 mg/dL (ref 0.2–1.2)
Total Protein: 6.3 g/dL (ref 6.0–8.3)

## 2023-06-24 LAB — BASIC METABOLIC PANEL
BUN: 16 mg/dL (ref 6–23)
CO2: 26 meq/L (ref 19–32)
Calcium: 9.5 mg/dL (ref 8.4–10.5)
Chloride: 106 meq/L (ref 96–112)
Creatinine, Ser: 0.96 mg/dL (ref 0.40–1.50)
GFR: 78.76 mL/min (ref >60.00)
Glucose, Bld: 119 mg/dL — ABNORMAL HIGH (ref 70–99)
Potassium: 4 meq/L (ref 3.5–5.1)
Sodium: 141 meq/L (ref 135–145)

## 2023-06-24 LAB — LIPASE: Lipase: 62 U/L — ABNORMAL HIGH (ref 11.0–59.0)

## 2023-06-24 LAB — CBC
HCT: 44.2 % (ref 39.0–52.0)
Hemoglobin: 14.5 g/dL (ref 13.0–17.0)
MCHC: 32.9 g/dL (ref 30.0–36.0)
MCV: 89.8 fL (ref 78.0–100.0)
Platelets: 240 10*3/uL (ref 150.0–400.0)
RBC: 4.92 Mil/uL (ref 4.22–5.81)
RDW: 14.1 % (ref 11.5–15.5)
WBC: 5.4 10*3/uL (ref 4.0–10.5)

## 2023-06-24 LAB — AMYLASE: Amylase: 54 U/L (ref 27–131)

## 2023-06-25 ENCOUNTER — Telehealth: Payer: Self-pay | Admitting: Family Medicine

## 2023-06-25 DIAGNOSIS — R1033 Periumbilical pain: Secondary | ICD-10-CM

## 2023-06-25 NOTE — Telephone Encounter (Signed)
[facility] called [phone] said they need a new order for abdominal ct abdominal pelvis with contrast only

## 2023-07-01 ENCOUNTER — Ambulatory Visit
Admission: RE | Admit: 2023-07-01 | Discharge: 2023-07-01 | Disposition: A | Discharge status: Home / Self Care | Payer: PPO | Source: Ambulatory Visit | Attending: Family Medicine | Admitting: Family Medicine

## 2023-07-01 DIAGNOSIS — R1033 Periumbilical pain: Secondary | ICD-10-CM

## 2023-07-01 MED ORDER — IOPAMIDOL (ISOVUE-300) INJECTION 61%
100.0000 mL | Freq: Once | INTRAVENOUS | Status: AC | PRN
  Administered 2023-07-01: 100 mL via INTRAVENOUS

## 2023-07-09 ENCOUNTER — Telehealth: Payer: Self-pay | Admitting: Family Medicine

## 2023-07-09 NOTE — Telephone Encounter (Signed)
Pt would like a call as soon as you get his results from his CT scan. He is anxious about the results.  [place] [phone]

## 2023-07-09 NOTE — Telephone Encounter (Signed)
Lft detailed VM that we don't have the report form the CT that ws done yet but will contact him when we do. Dm/cma

## 2023-07-16 NOTE — Telephone Encounter (Signed)
Called and spoke to [facility], they will send a request to get the report ASAP.  Dm/cma

## 2023-07-16 NOTE — Addendum Note (Signed)
Addended by: Andrez Grime on: 07/16/2023 01:23 PM   Modules accepted: Orders

## 2023-07-16 NOTE — Telephone Encounter (Signed)
Patient notified VIA phone that we are waiting on the report to come back. Dm/cma

## 2023-07-16 NOTE — Telephone Encounter (Signed)
Pt is checking on status of this message. Please advise

## 2023-07-17 NOTE — Telephone Encounter (Signed)
Results in chart.   Please review and advise.  Thanks.  Dm/cma

## 2023-07-24 ENCOUNTER — Ambulatory Visit (HOSPITAL_BASED_OUTPATIENT_CLINIC_OR_DEPARTMENT_OTHER)
Admission: RE | Admit: 2023-07-24 | Discharge: 2023-07-24 | Disposition: A | Discharge status: Home / Self Care | Payer: PPO | Source: Ambulatory Visit | Attending: Family Medicine | Admitting: Family Medicine

## 2023-07-24 DIAGNOSIS — N281 Cyst of kidney, acquired: Secondary | ICD-10-CM | POA: Diagnosis not present

## 2023-07-24 DIAGNOSIS — N2889 Other specified disorders of kidney and ureter: Secondary | ICD-10-CM | POA: Insufficient documentation

## 2023-07-24 MED ORDER — GADOBUTROL 1 MMOL/ML IV SOLN
7.5000 mL | Freq: Once | INTRAVENOUS | Status: AC | PRN
  Administered 2023-07-24: 7.5 mL via INTRAVENOUS
  Filled 2023-07-24: qty 7.5

## 2023-09-03 ENCOUNTER — Other Ambulatory Visit: Payer: Self-pay | Admitting: Family Medicine

## 2023-09-03 DIAGNOSIS — I10 Essential (primary) hypertension: Secondary | ICD-10-CM

## 2023-10-05 ENCOUNTER — Encounter: Payer: Self-pay | Admitting: Family Medicine

## 2023-10-05 ENCOUNTER — Ambulatory Visit (INDEPENDENT_AMBULATORY_CARE_PROVIDER_SITE_OTHER): Payer: PPO | Admitting: Family Medicine

## 2023-10-05 VITALS — BP 118/66 | HR 62 | Temp 98.0°F | Ht 71.0 in | Wt 172.6 lb

## 2023-10-05 DIAGNOSIS — R634 Abnormal weight loss: Secondary | ICD-10-CM | POA: Diagnosis not present

## 2023-10-05 DIAGNOSIS — I10 Essential (primary) hypertension: Secondary | ICD-10-CM | POA: Insufficient documentation

## 2023-10-05 DIAGNOSIS — E7800 Pure hypercholesterolemia, unspecified: Secondary | ICD-10-CM

## 2023-10-05 DIAGNOSIS — Z131 Encounter for screening for diabetes mellitus: Secondary | ICD-10-CM

## 2023-10-05 LAB — BASIC METABOLIC PANEL
BUN: 14 mg/dL (ref 6–23)
CO2: 24 meq/L (ref 19–32)
Calcium: 9.4 mg/dL (ref 8.4–10.5)
Chloride: 104 meq/L (ref 96–112)
Creatinine, Ser: 0.74 mg/dL (ref 0.40–1.50)
GFR: 90.11 mL/min (ref >60.00)
Glucose, Bld: 135 mg/dL — ABNORMAL HIGH (ref 70–99)
Potassium: 3.7 meq/L (ref 3.5–5.1)
Sodium: 136 meq/L (ref 135–145)

## 2023-10-05 LAB — HEMOGLOBIN A1C: Hgb A1c MFr Bld: 5.7 % (ref 4.6–6.5)

## 2023-10-05 NOTE — Progress Notes (Signed)
Established Patient Office Visit   Subjective:  Patient ID: Kerry Bright, male    DOB: 1951-01-12  Age: 73 y.o. MRN: [id number]  Chief Complaint  Patient presents with   Follow-up    HPI Encounter Diagnoses  Name Primary?   Essential hypertension Yes   Elevated LDL cholesterol level    Screening for diabetes mellitus    Weight loss, unintentional    For follow-up of above.  Weight has been stable.  He has managed to gain a couple pounds.  Improved his nutritional intake.  No family history of diabetes.  Continues with atorvastatin.  Blood pressure well-controlled with amlodipine   Review of Systems  Constitutional: Negative.   HENT: Negative.    Eyes:  Negative for blurred vision, discharge and redness.  Respiratory: Negative.    Cardiovascular: Negative.   Gastrointestinal:  Negative for abdominal pain.  Genitourinary: Negative.   Musculoskeletal: Negative.  Negative for myalgias.  Skin:  Negative for rash.  Neurological:  Negative for tingling, loss of consciousness and weakness.  Endo/Heme/Allergies:  Negative for polydipsia.     Current Outpatient Medications:    amLODipine (NORVASC) 5 MG tablet, TAKE 1 TABLET(5 MG) BY MOUTH DAILY, Disp: 90 tablet, Rfl: 1   atorvastatin (LIPITOR) 10 MG tablet, TAKE 1 TABLET(10 MG) BY MOUTH DAILY, Disp: 90 tablet, Rfl: 3   Multiple Vitamins-Minerals (MULTIVITAMIN ADULT) CHEW, Chew by mouth. CENTRUM MEN'S MULTIVITAMIN GUMMIES, Disp: , Rfl:    sildenafil (REVATIO) 20 MG tablet, May take 3-5 45 minutes prior. No more than 5 daily, Disp: 50 tablet, Rfl: 1   TURMERIC PO, Take by mouth., Disp: , Rfl:    VITAMIN D PO, Take by mouth., Disp: , Rfl:    Objective:     BP 118/66 (BP Location: Left Arm, Patient Position: Sitting, Cuff Size: Normal)   Pulse 62   Temp 98 F (36.7 C) (Temporal)   Ht 5\' 11"  (1.803 m)   Wt 172 lb 9.6 oz (78.3 kg)   SpO2 97%   BMI 24.07 kg/m  Wt Readings from Last 3 Encounters:  10/05/23 172 lb 9.6 oz  (78.3 kg)  06/23/23 169 lb 3.2 oz (76.7 kg)  04/02/23 171 lb 12.8 oz (77.9 kg)      Physical Exam Constitutional:      General: He is not in acute distress.    Appearance: Normal appearance. He is not ill-appearing, toxic-appearing or diaphoretic.  HENT:     Head: Normocephalic and atraumatic.     Right Ear: External ear normal.     Left Ear: External ear normal.     Mouth/Throat:     Mouth: Mucous membranes are moist.     Pharynx: Oropharynx is clear. No oropharyngeal exudate or posterior oropharyngeal erythema.  Eyes:     General: No scleral icterus.       Right eye: No discharge.        Left eye: No discharge.     Extraocular Movements: Extraocular movements intact.     Conjunctiva/sclera: Conjunctivae normal.     Pupils: Pupils are equal, round, and reactive to light.  Cardiovascular:     Rate and Rhythm: Normal rate and regular rhythm.  Pulmonary:     Effort: Pulmonary effort is normal. No respiratory distress.     Breath sounds: Normal breath sounds. No wheezing or rales.  Skin:    General: Skin is warm and dry.  Neurological:     Mental Status: He is alert and oriented to person, place,  and time.  Psychiatric:        Mood and Affect: Mood normal.        Behavior: Behavior normal.      No results found for any visits on 10/05/23.    The 10-year ASCVD risk score (Arnett DK, et al., 2019) is: 19.4%    Assessment & Plan:   Essential hypertension -     Basic metabolic panel  Elevated LDL cholesterol level  Screening for diabetes mellitus -     Basic metabolic panel -     Hemoglobin A1c  Weight loss, unintentional    Return in about 6 months (around 04/03/2024), or if symptoms worsen or fail to improve.    Mliss Sax, MD

## 2024-03-04 ENCOUNTER — Other Ambulatory Visit: Payer: Self-pay | Admitting: Family Medicine

## 2024-03-04 DIAGNOSIS — E7800 Pure hypercholesterolemia, unspecified: Secondary | ICD-10-CM

## 2024-03-28 ENCOUNTER — Ambulatory Visit (INDEPENDENT_AMBULATORY_CARE_PROVIDER_SITE_OTHER): Payer: PPO

## 2024-03-28 DIAGNOSIS — Z0000 Encounter for general adult medical examination without abnormal findings: Secondary | ICD-10-CM

## 2024-03-28 NOTE — Patient Instructions (Signed)
 [name] , Thank you for taking time out of your busy schedule to complete your Annual Wellness Visit with me. I enjoyed our conversation and look forward to speaking with you again next year. I, as well as your care team,  appreciate your ongoing commitment to your health goals. Please review the following plan we discussed and let me know if I can assist you in the future. Your Game plan/ To Do List    Referrals: If you haven't heard from the office you've been referred to, please reach out to them at the phone provided.  N/a Follow up Visits: Next Medicare AWV with our clinical staff: 03/31/2025 at 10:50   Have you seen your provider in the last 6 months (3 months if uncontrolled diabetes)? Yes Next Office Visit with your provider: 04/04/2024 at 1:00  Clinician Recommendations:  Aim for 30 minutes of exercise or brisk walking, 6-8 glasses of water, and 5 servings of fruits and vegetables each day.       This is a list of the screening recommended for you and due dates:  Health Maintenance  Topic Date Due   Flu Shot  04/08/2024   Colon Cancer Screening  01/29/2025   Medicare Annual Wellness Visit  03/28/2025   DTaP/Tdap/Td vaccine (2 - Td or Tdap) 12/10/2031   Pneumococcal Vaccine for age over 29  Completed   Hepatitis C Screening  Completed   Zoster (Shingles) Vaccine  Completed   Hepatitis B Vaccine  Aged Out   HPV Vaccine  Aged Out   Meningitis B Vaccine  Aged Out   COVID-19 Vaccine  Discontinued    Advanced directives: (Copy Requested) Please bring a copy of your health care power of attorney and living will to the office to be added to your chart at your convenience. You can mail to [facility] [address] [place], [place] [address] or email to [email or web address] Advance Care Planning is important because it:  [x]  Makes sure you receive the medical care that is consistent with your values, goals, and preferences  [x]  It provides guidance to your  family and loved ones and reduces their decisional burden about whether or not they are making the right decisions based on your wishes.  Follow the link provided in your after visit summary or read over the paperwork we have mailed to you to help you started getting your Advance Directives in place. If you need assistance in completing these, please reach out to us  so that we can help you!  See attachments for Preventive Care and Fall Prevention Tips.

## 2024-03-28 NOTE — Progress Notes (Signed)
 Subjective:   Kerry Bright is a 73 y.o. who presents for a Medicare Wellness preventive visit.  As a reminder, Annual Wellness Visits don't include a physical exam, and some assessments may be limited, especially if this visit is performed virtually. We may recommend an in-person follow-up visit with your provider if needed.  Visit Complete: Virtual I connected with  Kerry Bright on 03/28/24 by a audio enabled telemedicine application and verified that I am speaking with the correct person using two identifiers.  Patient Location: Home  Provider Location: Office/Clinic  I discussed the limitations of evaluation and management by telemedicine. The patient expressed understanding and agreed to proceed.  Vital Signs: Because this visit was a virtual/telehealth visit, some criteria may be missing or patient reported. Any vitals not documented were not able to be obtained and vitals that have been documented are patient reported.  VideoError- Librarian, academic were attempted between this provider and patient, however failed, due to patient having technical difficulties OR patient did not have access to video capability.  We continued and completed visit with audio only.   Persons Participating in Visit: Patient.  AWV Questionnaire: Yes: Patient Medicare AWV questionnaire was completed by the patient on 03/27/2024; I have confirmed that all information answered by patient is correct and no changes since this date.  Cardiac Risk Factors include: advanced age (>73men, >20 women);male gender     Objective:    Today's Vitals   There is no height or weight on file to calculate BMI.     03/28/2024   10:58 AM 03/20/2023    8:36 AM 03/12/2022   11:24 AM 05/11/2019   12:20 PM 05/11/2019    6:49 AM 05/04/2019    8:15 AM  Advanced Directives  Does Patient Have a Medical Advance Directive? Yes Yes Yes Yes Yes Yes  Type of Estate agent of  [place];Living will Healthcare Power of [place];Living will Healthcare Power of [place];Living will Healthcare Power of [place];Living will Healthcare Power of [place];Living will Healthcare Power of [place];Living will  Does patient want to make changes to medical advance directive?    No - Patient declined No - Patient declined No - Patient declined  Copy of Healthcare Power of Attorney in Chart? No - copy requested No - copy requested No - copy requested No - copy requested No - copy requested     Current Medications (verified) Outpatient Encounter Medications as of 03/28/2024  Medication Sig   atorvastatin  (LIPITOR) 10 MG tablet TAKE 1 TABLET(10 MG) BY MOUTH DAILY   Multiple Vitamins-Minerals (MULTIVITAMIN ADULT) CHEW Chew by mouth. CENTRUM MEN'S MULTIVITAMIN GUMMIES   amLODipine  (NORVASC ) 5 MG tablet TAKE 1 TABLET(5 MG) BY MOUTH DAILY (Patient not taking: Reported on 03/28/2024)   sildenafil  (REVATIO ) 20 MG tablet May take 3-5 45 minutes prior. No more than 5 daily (Patient not taking: Reported on 03/28/2024)   TURMERIC PO Take by mouth. (Patient not taking: Reported on 03/28/2024)   VITAMIN D  PO Take by mouth. (Patient not taking: Reported on 03/28/2024)   No facility-administered encounter medications on file as of 03/28/2024.    Allergies (verified) Patient has no known allergies.   History: Past Medical History:  Diagnosis Date   Allergy    SEASONAL   Arthritis    OA   Cancer (HCC)    SKIN CANCER- BASAL CELL PER PT   History of kidney stones    Hyperlipidemia    Hypertension    CONTROLLED  Past Surgical History:  Procedure Laterality Date   basal areas removed     ear top of head face   COLONOSCOPY     POLYPECTOMY  2013   HPP X 2   right foot lesion drained  2000   TONSILLECTOMY     TOTAL HIP ARTHROPLASTY Left 05/11/2019   Procedure: TOTAL HIP ARTHROPLASTY ANTERIOR APPROACH;  Surgeon: Fidel Rogue, MD;  Location: [facility];  Service: Orthopedics;  Laterality:  Left;   Family History  Problem Relation Age of Onset   Cancer Father    Cancer Daughter    Colon cancer Neg Hx    Colon polyps Neg Hx    Esophageal cancer Neg Hx    Rectal cancer Neg Hx    Stomach cancer Neg Hx    Social History   Socioeconomic History   Marital status: Married    Spouse name: Not on file   Number of children: Not on file   Years of education: Not on file   Highest education level: Bachelor's degree (e.g., BA, AB, BS)  Occupational History   Not on file  Tobacco Use   Smoking status: Former    Current packs/day: 1.50    Average packs/day: 1.5 packs/day for 6.0 years (9.0 ttl pk-yrs)    Types: Cigarettes   Smokeless tobacco: Former   Tobacco comments:    quit 1980  Vaping Use   Vaping status: Never Used  Substance and Sexual Activity   Alcohol  use: Yes    Comment: 2-3 beers per day   Drug use: Never   Sexual activity: Not Currently  Other Topics Concern   Not on file  Social History Narrative   Not on file   Social Drivers of Health   Financial Resource Strain: Low Risk  (03/27/2024)   Overall Financial Resource Strain (CARDIA)    Difficulty of Paying Living Expenses: Not very hard  Food Insecurity: No Food Insecurity (03/27/2024)   Hunger Vital Sign    Worried About Running Out of Food in the Last Year: Never true    Ran Out of Food in the Last Year: Never true  Transportation Needs: No Transportation Needs (03/27/2024)   PRAPARE - Administrator, Civil Service (Medical): No    Lack of Transportation (Non-Medical): No  Physical Activity: Sufficiently Active (03/28/2024)   Exercise Vital Sign    Days of Exercise per Week: 7 days    Minutes of Exercise per Session: 30 min  Stress: No Stress Concern Present (03/27/2024)   [employer] of Occupational Health - Occupational Stress Questionnaire    Feeling of Stress: Not at all  Social Connections: Unknown (03/27/2024)   Social Connection and Isolation Panel    Frequency of  Communication with Friends and Family: Three times a week    Frequency of Social Gatherings with Friends and Family: Patient declined    Attends Religious Services: Patient declined    Database administrator or Organizations: Patient declined    Attends Engineer, structural: Not on file    Marital Status: Married    Tobacco Counseling Counseling given: Not Answered Tobacco comments: quit 1980    Clinical Intake:  Pre-visit preparation completed: Yes  Pain : No/denies pain     Nutritional Risks: None Diabetes: No  Lab Results  Component Value Date   HGBA1C 5.7 10/05/2023     How often do you need to have someone help you when you read instructions, pamphlets, or other written materials  from your doctor or pharmacy?: 1 - Never  Interpreter Needed?: No  Information entered by :: NAllen LPN   Activities of Daily Living     03/27/2024    8:19 PM  In your present state of health, do you have any difficulty performing the following activities:  Hearing? 0  Vision? 0  Difficulty concentrating or making decisions? 0  Dressing or bathing? 0  Doing errands, shopping? 0  Preparing Food and eating ? N  Using the Toilet? N  In the past six months, have you accidently leaked urine? N  Do you have problems with loss of bowel control? N  Managing your Medications? N  Managing your Finances? N  Housekeeping or managing your Housekeeping? N    Patient Care Team: Berneta Elsie Sayre, MD as PCP - General (Family Medicine)  I have updated your Care Teams any recent Medical Services you may have received from other providers in the past year.     Assessment:   This is a routine wellness examination for [facility].  Hearing/Vision screen Hearing Screening - Comments:: Denies hearing issues Vision Screening - Comments:: Regular eye exams, in [place]   Goals Addressed             This Visit's Progress    Patient Stated       03/28/2024, wants to gain a  little weight       Depression Screen     03/28/2024   11:00 AM 04/02/2023    1:20 PM 03/20/2023    8:37 AM 03/12/2022   11:23 AM 03/12/2022   11:21 AM 12/09/2021    1:47 PM 12/09/2021    1:45 PM  PHQ 2/9 Scores  PHQ - 2 Score 0 0 0 0 0 0 0  PHQ- 9 Score 0  0        Fall Risk     03/27/2024    8:19 PM 04/02/2023    1:20 PM 03/20/2023    6:52 AM 03/12/2022   11:25 AM 12/09/2021    1:47 PM  Fall Risk   Falls in the past year? 0 0 0 0 0  Number falls in past yr: 0 0 0 0 0  Injury with Fall? 0 0 0 0   Risk for fall due to : Medication side effect No Fall Risks History of fall(s) No Fall Risks   Follow up Falls prevention discussed;Falls evaluation completed Falls evaluation completed  Falls evaluation completed;Education provided       Data saved with a previous flowsheet row definition    MEDICARE RISK AT HOME:  Medicare Risk at Home Any stairs in or around the home?: (Patient-Rptd) Yes If so, are there any without handrails?: (Patient-Rptd) No Home free of loose throw rugs in walkways, pet beds, electrical cords, etc?: (Patient-Rptd) No Adequate lighting in your home to reduce risk of falls?: (Patient-Rptd) Yes Life alert?: (Patient-Rptd) No Use of a cane, walker or w/c?: (Patient-Rptd) No Grab bars in the bathroom?: (Patient-Rptd) No Shower chair or bench in shower?: (Patient-Rptd) Yes Elevated toilet seat or a handicapped toilet?: (Patient-Rptd) No  TIMED UP AND GO:  Was the test performed?  No  Cognitive Function: 6CIT completed        03/28/2024   11:01 AM 03/20/2023    8:37 AM  6CIT Screen  What Year? 0 points 0 points  What month? 0 points 0 points  What time? 0 points 0 points  Count back from 20 0 points 0 points  Months in reverse 0 points 0 points  Repeat phrase 0 points 0 points  Total Score 0 points 0 points    Immunizations Immunization History  Administered Date(s) Administered   Fluad Quad(high Dose 65+) 07/01/2021, 06/17/2022   Fluad Trivalent(High  Dose 65+) 06/23/2023   Influenza, High Dose Seasonal PF 09/21/2017   Influenza,inj,Quad PF,6+ Mos 10/05/2018   PFIZER(Purple Top)SARS-COV-2 Vaccination 12/22/2019, 01/12/2020, 12/21/2020   PNEUMOCOCCAL CONJUGATE-20 04/02/2023   Pneumococcal Polysaccharide-23 09/21/2017   Tdap 12/09/2021   Zoster Recombinant(Shingrix ) 12/09/2021, 06/17/2022    Screening Tests Health Maintenance  Topic Date Due   INFLUENZA VACCINE  04/08/2024   Colonoscopy  01/29/2025   Medicare Annual Wellness (AWV)  03/28/2025   DTaP/Tdap/Td (2 - Td or Tdap) 12/10/2031   Pneumococcal Vaccine: 50+ Years  Completed   Hepatitis C Screening  Completed   Zoster Vaccines- Shingrix   Completed   Hepatitis B Vaccines  Aged Out   HPV VACCINES  Aged Out   Meningococcal B Vaccine  Aged Out   COVID-19 Vaccine  Discontinued    Health Maintenance  There are no preventive care reminders to display for this patient.  Health Maintenance Items Addressed: Up to date  Additional Screening:  Vision Screening: Recommended annual ophthalmology exams for early detection of glaucoma and other disorders of the eye. Would you like a referral to an eye doctor? No    Dental Screening: Recommended annual dental exams for proper oral hygiene  Community Resource Referral / Chronic Care Management: CRR required this visit?  No   CCM required this visit?  No   Plan:    I have personally reviewed and noted the following in the patient's chart:   Medical and social history Use of alcohol , tobacco or illicit drugs  Current medications and supplements including opioid prescriptions. Patient is not currently taking opioid prescriptions. Functional ability and status Nutritional status Physical activity Advanced directives List of other physicians Hospitalizations, surgeries, and ER visits in previous 12 months Vitals Screenings to include cognitive, depression, and falls Referrals and appointments  In addition, I have  reviewed and discussed with patient certain preventive protocols, quality metrics, and best practice recommendations. A written personalized care plan for preventive services as well as general preventive health recommendations were provided to patient.   Ardella FORBES Dawn, LPN   2/78/7974   After Visit Summary: (MyChart) Due to this being a telephonic visit, the after visit summary with patients personalized plan was offered to patient via MyChart   Notes: Nothing significant to report at this time.

## 2024-04-04 ENCOUNTER — Ambulatory Visit (INDEPENDENT_AMBULATORY_CARE_PROVIDER_SITE_OTHER): Payer: PPO | Admitting: Family Medicine

## 2024-04-04 ENCOUNTER — Ambulatory Visit: Payer: Self-pay | Admitting: Family Medicine

## 2024-04-04 ENCOUNTER — Encounter: Payer: Self-pay | Admitting: Family Medicine

## 2024-04-04 VITALS — BP 142/68 | HR 62 | Temp 97.5°F | Ht 71.0 in | Wt 172.0 lb

## 2024-04-04 DIAGNOSIS — R7303 Prediabetes: Secondary | ICD-10-CM | POA: Diagnosis not present

## 2024-04-04 DIAGNOSIS — I10 Essential (primary) hypertension: Secondary | ICD-10-CM | POA: Diagnosis not present

## 2024-04-04 DIAGNOSIS — E7800 Pure hypercholesterolemia, unspecified: Secondary | ICD-10-CM | POA: Diagnosis not present

## 2024-04-04 DIAGNOSIS — M25551 Pain in right hip: Secondary | ICD-10-CM

## 2024-04-04 DIAGNOSIS — R519 Headache, unspecified: Secondary | ICD-10-CM

## 2024-04-04 LAB — LDL CHOLESTEROL, DIRECT: Direct LDL: 59 mg/dL

## 2024-04-04 LAB — BASIC METABOLIC PANEL WITH GFR
BUN: 23 mg/dL (ref 6–23)
CO2: 24 meq/L (ref 19–32)
Calcium: 9.6 mg/dL (ref 8.4–10.5)
Chloride: 104 meq/L (ref 96–112)
Creatinine, Ser: 0.94 mg/dL (ref 0.40–1.50)
GFR: 80.33 mL/min (ref >60.00)
Glucose, Bld: 90 mg/dL (ref 70–99)
Potassium: 4.2 meq/L (ref 3.5–5.1)
Sodium: 138 meq/L (ref 135–145)

## 2024-04-04 LAB — HEMOGLOBIN A1C: Hgb A1c MFr Bld: 5.8 % (ref 4.6–6.5)

## 2024-04-04 MED ORDER — ATORVASTATIN CALCIUM 10 MG PO TABS: 10.0000 mg | ORAL_TABLET | Freq: Every day | ORAL | 3 refills | Status: AC

## 2024-04-04 MED ORDER — AMLODIPINE BESYLATE 5 MG PO TABS: 5.0000 mg | ORAL_TABLET | Freq: Every day | ORAL | 1 refills | Status: DC

## 2024-04-04 NOTE — Progress Notes (Signed)
 Established Patient Office Visit   Subjective:  Patient ID: Kerry Bright, male    DOB: 1951/06/13  Age: 73 y.o. MRN: [id number]  Chief Complaint  Patient presents with   Medical Management of Chronic Issues    6 month folllow up. Pt is not fasting. Pt will soon have right hip replacement.     HPI Encounter Diagnoses  Name Primary?   Prediabetes Yes   Essential hypertension    Elevated LDL cholesterol level    Right hip pain    Nonintractable headache, unspecified chronicity pattern, unspecified headache type    Has been out of his blood pressure medicine for a few months now.  1 month history of intermittent generalized headache that has been nonprogressive.  Denies visual changes, difficulty swallowing or unilateral weakness numbness or tingling.  No headache history.  It does respond to an aspirin .  He has been working in the yard in the heat.  Continues with atorvastatin .   Review of Systems  Constitutional: Negative.   HENT: Negative.    Eyes:  Negative for blurred vision, discharge and redness.  Respiratory: Negative.    Cardiovascular: Negative.   Gastrointestinal:  Negative for abdominal pain.  Genitourinary: Negative.   Musculoskeletal:  Positive for joint pain. Negative for myalgias.  Skin:  Negative for rash.  Neurological:  Positive for headaches. Negative for tingling, loss of consciousness and weakness.  Endo/Heme/Allergies:  Negative for polydipsia.     Current Outpatient Medications:    Multiple Vitamins-Minerals (MULTIVITAMIN ADULT) CHEW, Chew by mouth. CENTRUM MEN'S MULTIVITAMIN GUMMIES, Disp: , Rfl:    sildenafil  (REVATIO ) 20 MG tablet, May take 3-5 45 minutes prior. No more than 5 daily, Disp: 50 tablet, Rfl: 1   TURMERIC PO, Take by mouth., Disp: , Rfl:    VITAMIN D  PO, Take by mouth., Disp: , Rfl:    amLODipine  (NORVASC ) 5 MG tablet, Take 1 tablet (5 mg total) by mouth daily., Disp: 90 tablet, Rfl: 1   atorvastatin  (LIPITOR) 10 MG tablet, Take 1  tablet (10 mg total) by mouth daily., Disp: 90 tablet, Rfl: 3   Objective:     BP (!) 142/68 (BP Location: Right Arm, Patient Position: Sitting, Cuff Size: Normal)   Pulse 62   Temp (!) 97.5 F (36.4 C) (Temporal)   Ht 5' 11 (1.803 m)   Wt 172 lb (78 kg)   SpO2 94%   BMI 23.99 kg/m  BP Readings from Last 3 Encounters:  04/04/24 (!) 142/68  10/05/23 118/66  06/23/23 120/62   Wt Readings from Last 3 Encounters:  04/04/24 172 lb (78 kg)  10/05/23 172 lb 9.6 oz (78.3 kg)  06/23/23 169 lb 3.2 oz (76.7 kg)      Physical Exam Constitutional:      General: He is not in acute distress.    Appearance: Normal appearance. He is not ill-appearing, toxic-appearing or diaphoretic.  HENT:     Head: Normocephalic and atraumatic.     Right Ear: Tympanic membrane, ear canal and external ear normal.     Left Ear: Tympanic membrane, ear canal and external ear normal.     Mouth/Throat:     Mouth: Mucous membranes are moist.     Pharynx: Oropharynx is clear. No oropharyngeal exudate or posterior oropharyngeal erythema.  Eyes:     General: No visual field deficit or scleral icterus.       Right eye: No discharge.        Left eye: No discharge.  Extraocular Movements: Extraocular movements intact.     Conjunctiva/sclera: Conjunctivae normal.     Pupils: Pupils are equal, round, and reactive to light.  Cardiovascular:     Rate and Rhythm: Normal rate and regular rhythm.  Pulmonary:     Effort: Pulmonary effort is normal. No respiratory distress.     Breath sounds: Normal breath sounds.  Abdominal:     General: Bowel sounds are normal.  Musculoskeletal:     Cervical back: No rigidity or tenderness.     Right hip: Decreased range of motion.  Lymphadenopathy:     Cervical: No cervical adenopathy.  Skin:    General: Skin is warm and dry.  Neurological:     Mental Status: He is alert and oriented to person, place, and time.     Cranial Nerves: No cranial nerve deficit, dysarthria or  facial asymmetry.     Motor: No weakness.  Psychiatric:        Mood and Affect: Mood normal.        Behavior: Behavior normal.      No results found for any visits on 04/04/24.    The 10-year ASCVD risk score (Arnett DK, et al., 2019) is: 25.9%    Assessment & Plan:   Prediabetes -     Basic metabolic panel with GFR -     Hemoglobin A1c  Essential hypertension -     amLODIPine  Besylate; Take 1 tablet (5 mg total) by mouth daily.  Dispense: 90 tablet; Refill: 1  Elevated LDL cholesterol level -     Atorvastatin  Calcium ; Take 1 tablet (10 mg total) by mouth daily.  Dispense: 90 tablet; Refill: 3 -     LDL cholesterol, direct  Right hip pain  Nonintractable headache, unspecified chronicity pattern, unspecified headache type    Return in about 6 months (around 10/05/2024), or if symptoms worsen or fail to improve.  Suspected headache likely associated with untreated hypertension.  He will return if it worsens or does not improve with treatment of blood pressure.  Follow-up with orthopedist as planned.  Elsie Sim Lent, MD

## 2024-06-07 ENCOUNTER — Telehealth: Payer: Self-pay

## 2024-06-07 NOTE — Telephone Encounter (Signed)
 E2C2 called with patient question. Have we received his pre op paperwork If not, does he need to wait to schedule pre op appointment? Surgery scheduled 08/30/24  Please advise, Thank you

## 2024-06-08 NOTE — Telephone Encounter (Signed)
 Copied from CRM #[id number]. Topic: Appointments - Scheduling Inquiry for Clinic >> Jun 07, 2024  4:19 PM Charolett L wrote: Reason for CRM: patient called in and stated that on 08/17/24 he has to go to [facility] for [facility] appt and on 08/30/24 is his surgery date. Spoke with CAL to confirm if pre-opp clearance papers were received and she stated that she will give the patient a call back to schedule appt

## 2024-07-13 NOTE — Progress Notes (Addendum)
 Anesthesia Review:  PCP: Elsie Lent LOV 04/04/24 Clearance 06/09/24 in Media Tab  Cardiologist : none   PPM/ ICD: Device Orders: Rep Notified:  Chest x-ray : EKG : 07/19/24  Echo : Stress test: Cardiac Cath :   Activity level: can do a flight of stairs without difficutly  Sleep Study/ CPAP : none  Fasting Blood Sugar :      / Checks Blood Sugar -- times a day:    Blood Thinner/ Instructions /Last Dose: ASA / Instructions/ Last Dose :    PT called on 07/25/2024 and requested a call back,  Called back at 0755am on 07/25/2024 and left call back number. PT called back and question answered.

## 2024-07-14 NOTE — Patient Instructions (Addendum)
 SURGICAL WAITING ROOM VISITATION  Patients having surgery or a procedure may have no more than 2 support people in the waiting area - these visitors may rotate.    Children under the age of 81 must have an adult with them who is not the patient.  Visitors with respiratory illnesses are discouraged from visiting and should remain at home.  If the patient needs to stay at the hospital during part of their recovery, the visitor guidelines for inpatient rooms apply. Pre-op nurse will coordinate an appropriate time for 1 support person to accompany patient in pre-op.  This support person may not rotate.    Please refer to the [facility] website for the visitor guidelines for Inpatients (after your surgery is over and you are in a regular room).       Your procedure is scheduled on: b 07/26/2024    Report to [facility] Main Entrance    Report to admitting at  0830 AM   Call this number if you have problems the morning of surgery [phone]   Do not eat food :After Midnight.   After Midnight you may have the following liquids until __ 0800____ AM  DAY OF SURGERY  Water Non-Citrus Juices (without pulp, NO RED-Apple, White grape, White cranberry) Black Coffee (NO MILK/CREAM OR CREAMERS, sugar ok)  Clear Tea (NO MILK/CREAM OR CREAMERS, sugar ok) regular and decaf                             Plain Jell-O (NO RED)                                           Fruit ices (not with fruit pulp, NO RED)                                     Popsicles (NO RED)                                                               Sports drinks like Gatorade (NO RED)                   The day of surgery:  Drink ONE (1) Pre-Surgery Clear Ensure or G2 at   0800AM the morning of surgery. Drink in one sitting. Do not sip.  This drink was given to you during your hospital  pre-op appointment visit. Nothing else to drink after completing the  Pre-Surgery Clear Ensure or G2.          If you have  questions, please contact your surgeon's office.       Oral Hygiene is also important to reduce your risk of infection.                                    Remember - BRUSH YOUR TEETH THE MORNING OF SURGERY WITH YOUR REGULAR TOOTHPASTE  DENTURES WILL BE REMOVED PRIOR TO SURGERY PLEASE DO NOT APPLY Poly grip OR  ADHESIVES!!!   Do NOT smoke after Midnight   Stop all vitamins and herbal supplements 7 days before surgery.   Take these medicines the morning of surgery with A SIP OF WATER: amlodipine    DO NOT TAKE ANY ORAL DIABETIC MEDICATIONS DAY OF YOUR SURGERY  Bring CPAP mask and tubing day of surgery.                              You may not have any metal on your body including hair pins, jewelry, and body piercing             Do not wear make-up, lotions, powders, perfumes/cologne, or deodorant  Do not wear nail polish including gel and S&S, artificial/acrylic nails, or any other type of covering on natural nails including finger and toenails. If you have artificial nails, gel coating, etc. that needs to be removed by a nail salon please have this removed prior to surgery or surgery may need to be canceled/ delayed if the surgeon/ anesthesia feels like they are unable to be safely monitored.   Do not shave  48 hours prior to surgery.               Men may shave face and neck.   Do not bring valuables to the hospital. [facility] IS NOT             RESPONSIBLE   FOR VALUABLES.   Contacts, glasses, dentures or bridgework may not be worn into surgery.   Bring small overnight bag day of surgery.   DO NOT BRING YOUR HOME MEDICATIONS TO THE HOSPITAL. PHARMACY WILL DISPENSE MEDICATIONS LISTED ON YOUR MEDICATION LIST TO YOU DURING YOUR ADMISSION IN THE HOSPITAL!    Patients discharged on the day of surgery will not be allowed to drive home.  Someone NEEDS to stay with you for the first 24 hours after anesthesia.   Special Instructions: Bring a copy of your healthcare power of  attorney and living will documents the day of surgery if you haven't scanned them before.              Please read over the following fact sheets you were given: IF YOU HAVE QUESTIONS ABOUT YOUR PRE-OP INSTRUCTIONS PLEASE CALL [phone].   If you received a COVID test during your pre-op visit  it is requested that you wear a mask when out in public, stay away from anyone that may not be feeling well and notify your surgeon if you develop symptoms. If you test positive for Covid or have been in contact with anyone that has tested positive in the last 10 days please notify you surgeon.      Pre-operative 4 CHG Bath Instructions   You can play a key role in reducing the risk of infection after surgery. Your skin needs to be as free of germs as possible. You can reduce the number of germs on your skin by washing with CHG (chlorhexidine  gluconate) soap before surgery. CHG is an antiseptic soap that kills germs and continues to kill germs even after washing.   DO NOT use if you have an allergy to chlorhexidine /CHG or antibacterial soaps. If your skin becomes reddened or irritated, stop using the CHG and notify one of our RNs at [phone].   Please shower with the CHG soap starting 4 days before surgery using the following schedule:     Please keep in mind the following:  DO NOT shave, including legs and underarms, starting the day of your first shower.   You may shave your face at any point before/day of surgery.  Place clean sheets on your bed the day you start using CHG soap. Use a clean washcloth (not used since being washed) for each shower. DO NOT sleep with pets once you start using the CHG.   CHG Shower Instructions:  If you choose to wash your hair and private area, wash first with your normal shampoo/soap.  After you use shampoo/soap, rinse your hair and body thoroughly to remove shampoo/soap residue.  Turn the water OFF and apply about 3 tablespoons (45 ml) of CHG soap to a CLEAN  washcloth.  Apply CHG soap ONLY FROM YOUR NECK DOWN TO YOUR TOES (washing for 3-5 minutes)  DO NOT use CHG soap on face, private areas, open wounds, or sores.  Pay special attention to the area where your surgery is being performed.  If you are having back surgery, having someone wash your back for you may be helpful. Wait 2 minutes after CHG soap is applied, then you may rinse off the CHG soap.  Pat dry with a clean towel  Put on clean clothes/pajamas   If you choose to wear lotion, please use ONLY the CHG-compatible lotions on the back of this paper.     Additional instructions for the day of surgery: DO NOT APPLY any lotions, deodorants, cologne, or perfumes.   Put on clean/comfortable clothes.  Brush your teeth.  Ask your nurse before applying any prescription medications to the skin.      CHG Compatible Lotions   Aveeno Moisturizing lotion  Cetaphil Moisturizing Cream  Cetaphil Moisturizing Lotion  Clairol Herbal Essence Moisturizing Lotion, Dry Skin  Clairol Herbal Essence Moisturizing Lotion, Extra Dry Skin  Clairol Herbal Essence Moisturizing Lotion, Normal Skin  Curel Age Defying Therapeutic Moisturizing Lotion with Alpha Hydroxy  Curel Extreme Care Body Lotion  Curel Soothing Hands Moisturizing Hand Lotion  Curel Therapeutic Moisturizing Cream, Fragrance-Free  Curel Therapeutic Moisturizing Lotion, Fragrance-Free  Curel Therapeutic Moisturizing Lotion, Original Formula  Eucerin Daily Replenishing Lotion  Eucerin Dry Skin Therapy Plus Alpha Hydroxy Crme  Eucerin Dry Skin Therapy Plus Alpha Hydroxy Lotion  Eucerin Original Crme  Eucerin Original Lotion  Eucerin Plus Crme Eucerin Plus Lotion  Eucerin TriLipid Replenishing Lotion  Keri Anti-Bacterial Hand Lotion  Keri Deep Conditioning Original Lotion Dry Skin Formula Softly Scented  Keri Deep Conditioning Original Lotion, Fragrance Free Sensitive Skin Formula  Keri Lotion Fast Absorbing Fragrance Free Sensitive  Skin Formula  Keri Lotion Fast Absorbing Softly Scented Dry Skin Formula  Keri Original Lotion  Keri Skin Renewal Lotion Keri Silky Smooth Lotion  Keri Silky Smooth Sensitive Skin Lotion  Nivea Body Creamy Conditioning Oil  Nivea Body Extra Enriched Teacher, Adult Education Moisturizing Lotion Nivea Crme  Nivea Skin Firming Lotion  NutraDerm 30 Skin Lotion  NutraDerm Skin Lotion  NutraDerm Therapeutic Skin Cream  NutraDerm Therapeutic Skin Lotion  ProShield Protective Hand Cream  Provon moisturizing lotion

## 2024-07-19 ENCOUNTER — Encounter (HOSPITAL_COMMUNITY)
Admission: RE | Admit: 2024-07-19 | Discharge: 2024-07-19 | Disposition: A | Discharge status: Home / Self Care | Source: Ambulatory Visit | Attending: Orthopedic Surgery | Admitting: Orthopedic Surgery

## 2024-07-19 ENCOUNTER — Encounter (HOSPITAL_COMMUNITY): Payer: Self-pay

## 2024-07-19 ENCOUNTER — Other Ambulatory Visit: Payer: Self-pay

## 2024-07-19 VITALS — BP 134/65 | HR 63 | Temp 98.2°F | Resp 16 | Ht 71.0 in | Wt 192.0 lb

## 2024-07-19 DIAGNOSIS — Z01818 Encounter for other preprocedural examination: Secondary | ICD-10-CM | POA: Insufficient documentation

## 2024-07-19 LAB — CBC
HCT: 42.8 % (ref 39.0–52.0)
Hemoglobin: 14.1 g/dL (ref 13.0–17.0)
MCH: 29.6 pg (ref 26.0–34.0)
MCHC: 32.9 g/dL (ref 30.0–36.0)
MCV: 89.7 fL (ref 80.0–100.0)
Platelets: 240 K/uL (ref 150–400)
RBC: 4.77 MIL/uL (ref 4.22–5.81)
RDW: 13.7 % (ref 11.5–15.5)
WBC: 9 K/uL (ref 4.0–10.5)
nRBC: 0 % (ref 0.0–0.2)

## 2024-07-19 LAB — SURGICAL PCR SCREEN
MRSA, PCR: NEGATIVE
Staphylococcus aureus: NEGATIVE

## 2024-07-19 LAB — BASIC METABOLIC PANEL WITH GFR
Anion gap: 11 (ref 5–15)
BUN: 18 mg/dL (ref 8–23)
CO2: 24 mmol/L (ref 22–32)
Calcium: 9.4 mg/dL (ref 8.9–10.3)
Chloride: 104 mmol/L (ref 98–111)
Creatinine, Ser: 0.78 mg/dL (ref 0.61–1.24)
GFR, Estimated: >60 mL/min (ref >60)
Glucose, Bld: 98 mg/dL (ref 70–99)
Potassium: 4.4 mmol/L (ref 3.5–5.1)
Sodium: 139 mmol/L (ref 135–145)

## 2024-07-26 ENCOUNTER — Ambulatory Visit (HOSPITAL_COMMUNITY): Payer: Self-pay | Admitting: Physician Assistant

## 2024-07-26 ENCOUNTER — Ambulatory Visit (HOSPITAL_COMMUNITY)

## 2024-07-26 ENCOUNTER — Ambulatory Visit (HOSPITAL_COMMUNITY)
Admission: RE | Admit: 2024-07-26 | Discharge: 2024-07-26 | Disposition: A | Discharge status: Home / Self Care | Attending: Orthopedic Surgery | Admitting: Orthopedic Surgery

## 2024-07-26 ENCOUNTER — Ambulatory Visit (HOSPITAL_COMMUNITY): Admitting: Anesthesiology

## 2024-07-26 ENCOUNTER — Other Ambulatory Visit: Payer: Self-pay

## 2024-07-26 ENCOUNTER — Encounter (HOSPITAL_COMMUNITY): Payer: Self-pay | Admitting: Orthopedic Surgery

## 2024-07-26 ENCOUNTER — Encounter (HOSPITAL_COMMUNITY)
Admission: RE | Disposition: A | Discharge status: Home / Self Care | Payer: Self-pay | Source: Home / Self Care | Attending: Orthopedic Surgery

## 2024-07-26 DIAGNOSIS — Z87891 Personal history of nicotine dependence: Secondary | ICD-10-CM | POA: Insufficient documentation

## 2024-07-26 DIAGNOSIS — M25751 Osteophyte, right hip: Secondary | ICD-10-CM | POA: Insufficient documentation

## 2024-07-26 DIAGNOSIS — M1611 Unilateral primary osteoarthritis, right hip: Secondary | ICD-10-CM | POA: Insufficient documentation

## 2024-07-26 DIAGNOSIS — Z79899 Other long term (current) drug therapy: Secondary | ICD-10-CM | POA: Diagnosis not present

## 2024-07-26 DIAGNOSIS — Z96641 Presence of right artificial hip joint: Secondary | ICD-10-CM

## 2024-07-26 DIAGNOSIS — I10 Essential (primary) hypertension: Secondary | ICD-10-CM | POA: Insufficient documentation

## 2024-07-26 DIAGNOSIS — Z96642 Presence of left artificial hip joint: Secondary | ICD-10-CM | POA: Diagnosis not present

## 2024-07-26 DIAGNOSIS — Z01818 Encounter for other preprocedural examination: Secondary | ICD-10-CM

## 2024-07-26 HISTORY — PX: TOTAL HIP ARTHROPLASTY: SHX124

## 2024-07-26 LAB — TYPE AND SCREEN
ABO/RH(D): B POS
Antibody Screen: NEGATIVE

## 2024-07-26 SURGERY — ARTHROPLASTY, HIP, TOTAL, ANTERIOR APPROACH
Anesthesia: Monitor Anesthesia Care | Site: Hip | Laterality: Right

## 2024-07-26 MED ORDER — KETOROLAC TROMETHAMINE 30 MG/ML IJ SOLN
INTRAMUSCULAR | Status: AC
  Filled 2024-07-26: qty 1

## 2024-07-26 MED ORDER — PROPOFOL 10 MG/ML IV BOLUS
INTRAVENOUS | Status: DC | PRN
  Administered 2024-07-26: 10 mg via INTRAVENOUS
  Administered 2024-07-26: 30 mg via INTRAVENOUS

## 2024-07-26 MED ORDER — LACTATED RINGERS IV BOLUS: 500.0000 mL | Freq: Once | INTRAVENOUS | Status: DC

## 2024-07-26 MED ORDER — BUPIVACAINE IN DEXTROSE 0.75-8.25 % IT SOLN
INTRATHECAL | Status: DC | PRN
  Administered 2024-07-26: 2 mL via INTRATHECAL

## 2024-07-26 MED ORDER — METHOCARBAMOL 500 MG PO TABS
ORAL_TABLET | ORAL | Status: AC
  Filled 2024-07-26: qty 1

## 2024-07-26 MED ORDER — FENTANYL CITRATE (PF) 100 MCG/2ML IJ SOLN
INTRAMUSCULAR | Status: DC | PRN
  Administered 2024-07-26 (×2): 25 ug via INTRAVENOUS
  Administered 2024-07-26: 50 ug via INTRAVENOUS

## 2024-07-26 MED ORDER — SODIUM CHLORIDE (PF) 0.9 % IJ SOLN
INTRAMUSCULAR | Status: AC
  Filled 2024-07-26: qty 30

## 2024-07-26 MED ORDER — PROPOFOL 1000 MG/100ML IV EMUL
INTRAVENOUS | Status: AC
Start: 2024-07-26 — End: 2024-07-26
  Filled 2024-07-26: qty 100

## 2024-07-26 MED ORDER — STERILE WATER FOR IRRIGATION IR SOLN
Status: DC | PRN
Start: 2024-07-26 — End: 2024-07-26
  Administered 2024-07-26: 2000 mL

## 2024-07-26 MED ORDER — DEXAMETHASONE SOD PHOSPHATE PF 10 MG/ML IJ SOLN
8.0000 mg | Freq: Once | INTRAMUSCULAR | Status: AC
  Administered 2024-07-26: 8 mg via INTRAVENOUS

## 2024-07-26 MED ORDER — CHLORHEXIDINE GLUCONATE 0.12 % MT SOLN
15.0000 mL | Freq: Once | OROMUCOSAL | Status: AC
  Administered 2024-07-26: 15 mL via OROMUCOSAL

## 2024-07-26 MED ORDER — POLYETHYLENE GLYCOL 3350 17 G PO PACK: 17.0000 g | PACK | Freq: Two times a day (BID) | ORAL | Status: AC

## 2024-07-26 MED ORDER — METHOCARBAMOL 500 MG PO TABS
500.0000 mg | ORAL_TABLET | Freq: Four times a day (QID) | ORAL | Status: DC | PRN
Start: 2024-07-26 — End: 2024-07-27
  Administered 2024-07-26: 500 mg via ORAL

## 2024-07-26 MED ORDER — METHOCARBAMOL 1000 MG/10ML IJ SOLN
500.0000 mg | Freq: Four times a day (QID) | INTRAMUSCULAR | Status: DC | PRN
Start: 2024-07-26 — End: 2024-07-27

## 2024-07-26 MED ORDER — PROPOFOL 500 MG/50ML IV EMUL
INTRAVENOUS | Status: AC
  Filled 2024-07-26: qty 50

## 2024-07-26 MED ORDER — OXYCODONE HCL 5 MG/5ML PO SOLN: 5.0000 mg | Freq: Once | ORAL | Status: DC | PRN

## 2024-07-26 MED ORDER — PROPOFOL 500 MG/50ML IV EMUL
INTRAVENOUS | Status: DC | PRN
  Administered 2024-07-26: 60 ug/kg/min via INTRAVENOUS

## 2024-07-26 MED ORDER — ONDANSETRON HCL 4 MG/2ML IJ SOLN
INTRAMUSCULAR | Status: DC | PRN
  Administered 2024-07-26: 4 mg via INTRAVENOUS

## 2024-07-26 MED ORDER — LACTATED RINGERS IV SOLN: INTRAVENOUS | Status: DC

## 2024-07-26 MED ORDER — ACETAMINOPHEN 10 MG/ML IV SOLN: 1000.0000 mg | Freq: Once | INTRAVENOUS | Status: DC | PRN

## 2024-07-26 MED ORDER — SENNA 8.6 MG PO TABS: 1.0000 | ORAL_TABLET | Freq: Every day | ORAL | 0 refills | Status: AC

## 2024-07-26 MED ORDER — BUPIVACAINE-EPINEPHRINE (PF) 0.25% -1:200000 IJ SOLN
INTRAMUSCULAR | Status: AC
  Filled 2024-07-26: qty 30

## 2024-07-26 MED ORDER — 0.9 % SODIUM CHLORIDE (POUR BTL) OPTIME
TOPICAL | Status: DC | PRN
  Administered 2024-07-26: 1000 mL

## 2024-07-26 MED ORDER — TRANEXAMIC ACID-NACL 1000-0.7 MG/100ML-% IV SOLN
1000.0000 mg | Freq: Once | INTRAVENOUS | Status: AC
  Administered 2024-07-26: 1000 mg via INTRAVENOUS

## 2024-07-26 MED ORDER — FENTANYL CITRATE (PF) 100 MCG/2ML IJ SOLN
INTRAMUSCULAR | Status: AC
  Filled 2024-07-26: qty 2

## 2024-07-26 MED ORDER — ASPIRIN 81 MG PO CHEW: 81.0000 mg | CHEWABLE_TABLET | Freq: Two times a day (BID) | ORAL | 0 refills | Status: AC

## 2024-07-26 MED ORDER — OXYCODONE HCL 5 MG PO TABS: 5.0000 mg | ORAL_TABLET | Freq: Once | ORAL | Status: DC | PRN

## 2024-07-26 MED ORDER — LACTATED RINGERS IV BOLUS
500.0000 mL | Freq: Once | INTRAVENOUS | Status: AC
  Administered 2024-07-26: 500 mL via INTRAVENOUS

## 2024-07-26 MED ORDER — OXYCODONE HCL 5 MG PO TABS: 5.0000 mg | ORAL_TABLET | ORAL | 0 refills | Status: AC | PRN

## 2024-07-26 MED ORDER — OXYCODONE HCL 5 MG PO TABS
ORAL_TABLET | ORAL | Status: AC
  Filled 2024-07-26: qty 1

## 2024-07-26 MED ORDER — SODIUM CHLORIDE (PF) 0.9 % IJ SOLN
INTRAMUSCULAR | Status: DC | PRN
  Administered 2024-07-26: 61 mL

## 2024-07-26 MED ORDER — CEFAZOLIN SODIUM-DEXTROSE 2-4 GM/100ML-% IV SOLN: 2.0000 g | Freq: Four times a day (QID) | INTRAVENOUS | Status: DC

## 2024-07-26 MED ORDER — POVIDONE-IODINE 10 % EX SWAB: 2.0000 | Freq: Once | CUTANEOUS | Status: DC

## 2024-07-26 MED ORDER — OXYCODONE HCL 5 MG PO TABS
5.0000 mg | ORAL_TABLET | ORAL | Status: DC | PRN
  Administered 2024-07-26: 5 mg via ORAL

## 2024-07-26 MED ORDER — METHOCARBAMOL 500 MG PO TABS: 500.0000 mg | ORAL_TABLET | Freq: Four times a day (QID) | ORAL | 1 refills | Status: AC | PRN

## 2024-07-26 MED ORDER — TRANEXAMIC ACID-NACL 1000-0.7 MG/100ML-% IV SOLN
INTRAVENOUS | Status: AC
  Filled 2024-07-26: qty 100

## 2024-07-26 MED ORDER — PHENYLEPHRINE HCL-NACL 20-0.9 MG/250ML-% IV SOLN
INTRAVENOUS | Status: DC | PRN
  Administered 2024-07-26: 30 ug/min via INTRAVENOUS

## 2024-07-26 MED ORDER — FENTANYL CITRATE (PF) 50 MCG/ML IJ SOSY: 25.0000 ug | PREFILLED_SYRINGE | INTRAMUSCULAR | Status: DC | PRN

## 2024-07-26 MED ORDER — CEFAZOLIN SODIUM-DEXTROSE 2-4 GM/100ML-% IV SOLN
2.0000 g | INTRAVENOUS | Status: AC
  Administered 2024-07-26: 2 g via INTRAVENOUS
  Filled 2024-07-26: qty 100

## 2024-07-26 MED ORDER — ORAL CARE MOUTH RINSE: 15.0000 mL | Freq: Once | OROMUCOSAL | Status: AC

## 2024-07-26 MED ORDER — TRANEXAMIC ACID-NACL 1000-0.7 MG/100ML-% IV SOLN
1000.0000 mg | INTRAVENOUS | Status: AC
  Administered 2024-07-26: 1000 mg via INTRAVENOUS
  Filled 2024-07-26: qty 100

## 2024-07-26 MED ORDER — ONDANSETRON HCL 4 MG/2ML IJ SOLN
INTRAMUSCULAR | Status: AC
Start: 2024-07-26 — End: 2024-07-26
  Filled 2024-07-26: qty 2

## 2024-07-26 SURGICAL SUPPLY — 38 items
BAG COUNTER SPONGE SURGICOUNT (BAG) IMPLANT
BAG ZIPLOCK 12X15 (MISCELLANEOUS) ×2 IMPLANT
BLADE SAG 18X100X1.27 (BLADE) ×1 IMPLANT
COVER PERINEAL POST (MISCELLANEOUS) ×2 IMPLANT
COVER SURGICAL LIGHT HANDLE (MISCELLANEOUS) ×2 IMPLANT
CUP ACET PINNACLE SECTR 58MM (Hips) IMPLANT
DERMABOND ADVANCED .7 DNX12 (GAUZE/BANDAGES/DRESSINGS) ×2 IMPLANT
DRAPE FOOT SWITCH (DRAPES) ×2 IMPLANT
DRAPE STERI IOBAN 125X83 (DRAPES) ×1 IMPLANT
DRAPE U-SHAPE 47X51 STRL (DRAPES) ×4 IMPLANT
DRESSING AQUACEL AG SP 3.5X10 (GAUZE/BANDAGES/DRESSINGS) ×1 IMPLANT
DRSG AQUACEL AG ADV 3.5X10 (GAUZE/BANDAGES/DRESSINGS) IMPLANT
DURAPREP 26ML APPLICATOR (WOUND CARE) ×2 IMPLANT
ELECT REM PT RETURN 15FT ADLT (MISCELLANEOUS) ×1 IMPLANT
GLOVE BIO SURGEON STRL SZ 6 (GLOVE) ×1 IMPLANT
GLOVE BIOGEL PI IND STRL 6.5 (GLOVE) ×1 IMPLANT
GLOVE BIOGEL PI IND STRL 7.5 (GLOVE) ×2 IMPLANT
GLOVE ORTHO TXT STRL SZ7.5 (GLOVE) ×4 IMPLANT
GOWN STRL REUS W/ TWL LRG LVL3 (GOWN DISPOSABLE) ×2 IMPLANT
HEAD CERAMIC 36 PLUS5 (Hips) IMPLANT
HOLDER FOLEY CATH W/STRAP (MISCELLANEOUS) ×1 IMPLANT
KIT TURNOVER KIT A (KITS) ×2 IMPLANT
LINER NEUTRAL 36X58 PLUS4 IMPLANT
MANIFOLD NEPTUNE II (INSTRUMENTS) ×1 IMPLANT
NDL SAFETY ECLIPSE 18X1.5 (NEEDLE) ×1 IMPLANT
PACK ANTERIOR HIP CUSTOM (KITS) ×1 IMPLANT
PENCIL SMOKE EVACUATOR (MISCELLANEOUS) ×1 IMPLANT
SCREW 6.5MMX30MM (Screw) IMPLANT
STEM FEMORAL SZ6 HIGH ACTIS (Stem) IMPLANT
SUT MNCRL AB 4-0 PS2 18 (SUTURE) ×1 IMPLANT
SUT VIC AB 1 CT1 36 (SUTURE) ×6 IMPLANT
SUT VIC AB 2-0 CT1 TAPERPNT 27 (SUTURE) ×4 IMPLANT
SUTURE STRATFX 0 PDS 27 VIOLET (SUTURE) ×1 IMPLANT
SYR 3ML LL SCALE MARK (SYRINGE) ×1 IMPLANT
TOWEL GREEN STERILE FF (TOWEL DISPOSABLE) ×1 IMPLANT
TRAY FOLEY MTR SLVR 16FR STAT (SET/KITS/TRAYS/PACK) ×1 IMPLANT
TUBE SUCTION HIGH CAP CLEAR NV (SUCTIONS) ×2 IMPLANT
WATER STERILE IRR 1000ML POUR (IV SOLUTION) ×2 IMPLANT

## 2024-07-26 NOTE — Interval H&P Note (Signed)
 History and Physical Interval Note:  07/26/2024 9:46 AM  [name]  has presented today for surgery, with the diagnosis of Right hip osteoarthritis.  The various methods of treatment have been discussed with the patient and family. After consideration of risks, benefits and other options for treatment, the patient has consented to  Procedure(s): ARTHROPLASTY, HIP, TOTAL, ANTERIOR APPROACH (Right) as a surgical intervention.  The patient's history has been reviewed, patient examined, no change in status, stable for surgery.  I have reviewed the patient's chart and labs.  Questions were answered to the patient's satisfaction.     Kerry Bright

## 2024-07-26 NOTE — Anesthesia Postprocedure Evaluation (Signed)
 Anesthesia Post Note  Patient: Kerry Bright  Procedure(s) Performed: ARTHROPLASTY, HIP, TOTAL, ANTERIOR APPROACH (Right: Hip)     Patient location during evaluation: PACU Anesthesia Type: MAC and Spinal Level of consciousness: awake and alert Pain management: pain level controlled Vital Signs Assessment: post-procedure vital signs reviewed and stable Respiratory status: spontaneous breathing, nonlabored ventilation and respiratory function stable Cardiovascular status: stable and blood pressure returned to baseline Postop Assessment: no apparent nausea or vomiting Anesthetic complications: no   No notable events documented.  Last Vitals:  Vitals:   07/26/24 1400 07/26/24 1500  BP: (!) 140/69 120/73  Pulse: (!) 54 87  Resp:    Temp:    SpO2: 97% 98%    Last Pain:  Vitals:   07/26/24 1509  TempSrc:   PainSc: 4                  Ramces Shomaker

## 2024-07-26 NOTE — H&P (Signed)
 TOTAL HIP ADMISSION H&P  Patient is admitted for right total hip arthroplasty.  Therapy Plans: HEP Disposition: Home with wife Planned DVT Prophylaxis: aspirin  81mg  BID DME needed: walker PCP: Dr. berneta - clearance received TXA: IV Allergies: NKDA Anesthesia Concerns: none BMI: 23.3 Last HgbA1c: Not diabetic   Other: - SDD - History of left THA by Dr. Fidel in 2020 - did very well - No hx of VTE or cancer - oxycodone, robaxin , tylenol , celebrex 100 BID  Subjective:  Chief Complaint: right hip pain  HPI: Kerry Bright, 73 y.o. male, has a history of pain and functional disability in the right hip(s) due to arthritis and patient has failed non-surgical conservative treatments for greater than 12 weeks to include NSAID's and/or analgesics and activity modification.  Onset of symptoms was gradual starting 2 years ago with gradually worsening course since that time.The patient noted no past surgery on the right hip(s).  Patient currently rates pain in the right hip at 8 out of 10 with activity. Patient has night pain, worsening of pain with activity and weight bearing, and pain that interfers with activities of daily living. Patient has evidence of joint space narrowing by imaging studies. This condition presents safety issues increasing the risk of falls.   There is no current active infection.  Patient Active Problem List   Diagnosis Date Noted   Prediabetes 04/04/2024   Essential hypertension 10/05/2023   Hydrocele 02/20/2022   Orchitis and epididymitis 02/06/2022   Testicular mass 02/06/2022   Elevated LDL cholesterol level 10/03/2021   Alcohol  use 10/03/2021   Elevated BP without diagnosis of hypertension 07/01/2021   Cervical radiculopathy 07/01/2021   Osteoarthritis of left hip 05/11/2019   Erectile dysfunction due to arterial insufficiency 01/18/2019   Right hip pain 10/05/2018   Need for influenza vaccination 09/21/2017   Need for 23-polyvalent pneumococcal  polysaccharide vaccine 09/21/2017   Screening for diabetes mellitus 09/21/2017   Other complicated headache syndrome 09/21/2017   Past Medical History:  Diagnosis Date   Allergy    SEASONAL   Arthritis    OA   Cancer (HCC)    SKIN CANCER- BASAL CELL PER PT   History of kidney stones    Hyperlipidemia    Hypertension    CONTROLLED    Past Surgical History:  Procedure Laterality Date   basal areas removed     ear top of head face   COLONOSCOPY     KIDNEY STONE SURGERY     POLYPECTOMY  2013   HPP X 2   right foot lesion drained  2000   TONSILLECTOMY     TOTAL HIP ARTHROPLASTY Left 05/11/2019   Procedure: TOTAL HIP ARTHROPLASTY ANTERIOR APPROACH;  Surgeon: Fidel Rogue, MD;  Location: [facility];  Service: Orthopedics;  Laterality: Left;    No current facility-administered medications for this encounter.   Current Outpatient Medications  Medication Sig Dispense Refill Last Dose/Taking   amLODipine  (NORVASC ) 5 MG tablet Take 1 tablet (5 mg total) by mouth daily. 90 tablet 1 Taking   atorvastatin  (LIPITOR) 10 MG tablet Take 1 tablet (10 mg total) by mouth daily. 90 tablet 3 Taking   CELEBREX 100 MG capsule Take 100 mg by mouth daily as needed for moderate pain (pain score 4-6).   Taking As Needed   sildenafil  (REVATIO ) 20 MG tablet May take 3-5 45 minutes prior. No more than 5 daily 50 tablet 1    No Known Allergies  Social History   Tobacco Use  Smoking status: Former    Current packs/day: 1.50    Average packs/day: 1.5 packs/day for 6.0 years (9.0 ttl pk-yrs)    Types: Cigarettes   Smokeless tobacco: Former   Tobacco comments:    quit 1980  Substance Use Topics   Alcohol  use: Yes    Comment: 2-3 beers per day    Family History  Problem Relation Age of Onset   Cancer Father    Cancer Daughter    Colon cancer Neg Hx    Colon polyps Neg Hx    Esophageal cancer Neg Hx    Rectal cancer Neg Hx    Stomach cancer Neg Hx      Review of Systems  Constitutional:   Negative for chills and fever.  Respiratory:  Negative for cough and shortness of breath.   Cardiovascular:  Negative for chest pain.  Gastrointestinal:  Negative for nausea and vomiting.  Musculoskeletal:  Positive for arthralgias.     Objective:  Physical Exam Right hip exam: Painful and limited hip flexion internal rotation beyond neutral with pelvic tilting, external rotation to 20 degrees Mild external rotation contracture with active hip flexion Left hip exam reveals fluid range of motion from 20 degrees of internal to 30 degrees of external rotation 5/5 strength No lower extremity edema, erythema or calf tenderness  Vital signs in last 24 hours:    Labs:   Estimated body mass index is 26.78 kg/m as calculated from the following:   Height as of 07/19/24: 5' 11 (1.803 m).   Weight as of 07/19/24: 87.1 kg.   Imaging Review Plain radiographs demonstrate severe degenerative joint disease of the right hip(s). The bone quality appears to be adequate for age and reported activity level.      Assessment/Plan:  End stage arthritis, right hip(s)  The patient history, physical examination, clinical judgement of the provider and imaging studies are consistent with end stage degenerative joint disease of the right hip(s) and total hip arthroplasty is deemed medically necessary. The treatment options including medical management, injection therapy, arthroscopy and arthroplasty were discussed at length. The risks and benefits of total hip arthroplasty were presented and reviewed. The risks due to aseptic loosening, infection, stiffness, dislocation/subluxation,  thromboembolic complications and other imponderables were discussed.  The patient acknowledged the explanation, agreed to proceed with the plan and consent was signed. Patient is being admitted for inpatient treatment for surgery, pain control, PT, OT, prophylactic antibiotics, VTE prophylaxis, progressive ambulation and  ADL's and discharge planning.The patient is planning to be discharged home.   Rosina Calin, PA-C Orthopedic Surgery [facility] [place] [phone]

## 2024-07-26 NOTE — Op Note (Signed)
 NAME:  Kerry Bright                ACCOUNT NO.: [id number]      MEDICAL RECORD NO.: [id number]      FACILITY:  [facility]      PHYSICIAN:  Donnice JONETTA Car  DATE OF BIRTH:  03/25/1951     DATE OF PROCEDURE:  07/26/2024                                 OPERATIVE REPORT         PREOPERATIVE DIAGNOSIS: Right  hip osteoarthritis.      POSTOPERATIVE DIAGNOSIS:  Right hip osteoarthritis.      PROCEDURE:  Right total hip replacement through an anterior approach   utilizing DePuy THR system, component size 58 mm pinnacle cup, a size 36+4 neutral   Altrex liner, a size 6 Hi Actis stem with a 36+5 delta ceramic   ball.      SURGEON:  Donnice JONETTA. Car, M.D.      ASSISTANT:  Rosina Calin, PA-C     ANESTHESIA:  Spinal.      SPECIMENS:  None.      COMPLICATIONS:  None.      BLOOD LOSS:  250 cc     DRAINS:  None.      INDICATION OF THE PROCEDURE:  Kerry Bright is a 73 y.o. male who had   presented to office for evaluation of right hip pain.  Radiographs revealed   progressive degenerative changes with bone-on-bone   articulation of the  hip joint, including subchondral cystic changes and osteophytes.  The patient had painful limited range of   motion significantly affecting their overall quality of life and function.  The patient was failing to    respond to conservative measures including medications and/or injections and activity modification and at this point was ready   to proceed with more definitive measures.  Consent was obtained for   benefit of pain relief.  Specific risks of infection, DVT, component   failure, dislocation, neurovascular injury, and need for revision surgery were reviewed in the office. He has a history of left total hip replacement.   PROCEDURE IN DETAIL:  The patient was brought to operative theater.   Once adequate anesthesia, preoperative antibiotics, 2 gm of Ancef , 1 gm of Tranexamic Acid , and 10 mg of Decadron  were administered,  the patient was positioned supine on the [employer] table.  Once the patient was safely positioned with adequate padding of boney prominences we predraped out the hip, and used fluoroscopy to confirm orientation of the pelvis.      The right hip was then prepped and draped from proximal iliac crest to   mid thigh with a shower curtain technique.      Time-out was performed identifying the patient, planned procedure, and the appropriate extremity.     An incision was then made 2 cm lateral to the   anterior superior iliac spine extending over the orientation of the   tensor fascia lata muscle and sharp dissection was carried down to the   fascia of the muscle.      The fascia was then incised.  The muscle belly was identified and swept   laterally and retractor placed along the superior neck.  Following   cauterization of the circumflex vessels and removing some pericapsular   fat, a second cobra retractor  was placed on the inferior neck.  A T-capsulotomy was made along the line of the   superior neck to the trochanteric fossa, then extended proximally and   distally.  Tag sutures were placed and the retractors were then placed   intracapsular.  We then identified the trochanteric fossa and   orientation of my neck cut and then made a neck osteotomy with the femur on traction.  The femoral   head was removed without difficulty or complication.  Traction was let   off and retractors were placed posterior and anterior around the   acetabulum.      The labrum and foveal tissue were debrided.  I began reaming with a 54 mm   reamer and reamed up to 57 mm reamer with good bony bed preparation and a 58 mm  cup was chosen.  The final 58 mm Pinnacle cup was then impacted under fluoroscopy to confirm the depth of penetration and orientation with respect to   Abduction and forward flexion.  A screw was placed into the ilium followed by the hole eliminator.  The final   36+4 neutral Altrex liner was  impacted with good visualized rim fit.  The cup was positioned anatomically within the acetabular portion of the pelvis.      At this point, the femur was rolled to 100 degrees.  Further capsule was   released off the inferior aspect of the femoral neck.  I then   released the superior capsule proximally.  With the leg in a neutral position the hook was placed laterally   along the femur under the vastus lateralis origin and elevated manually and then held in position using the hook attachment on the bed.  The leg was then extended and adducted with the leg rolled to 100   degrees of external rotation.  Retractors were placed along the medial calcar and posteriorly over the greater trochanter.  Once the proximal femur was fully   exposed, I used a box osteotome to set orientation.  I then began   broaching with the starting chili pepper broach and passed this by hand and then broached up to 6.  With the 6 broach in place I chose a high offset neck and did several trial reductions.  The offset was appropriate, leg lengths   appeared to be equal best matched with the +5 head ball trial confirmed radiographically.   Given these findings, I went ahead and dislocated the hip, repositioned all   retractors and positioned the right hip in the extended and abducted position.  The final 6 Hi Actis stem was   chosen and it was impacted down to the level of neck cut.  Based on this   and the trial reductions, a final 36+5 delta ceramic ball was chosen and   impacted onto a clean and dry trunnion, and the hip was reduced.  The   hip had been irrigated throughout the case again at this point.  I did   reapproximate the superior capsular leaflet to the anterior leaflet   using #1 Vicryl.  The fascia of the   tensor fascia lata muscle was then reapproximated using #1 Vicryl and #0 Stratafix sutures.  The   remaining wound was closed with 2-0 Vicryl and running 4-0 Monocryl.   The hip was cleaned, dried, and  dressed sterilely using Dermabond and   Aquacel dressing.  The patient was then brought   to recovery room in stable condition tolerating the procedure  well.    Rosina Calin, PA-C was present for the entirety of the case involved from   preoperative positioning, perioperative retractor management, general   facilitation of the case, as well as primary wound closure as assistant.            Donnice CORDOBA Ernie, M.D.        07/26/2024 10:54 AM

## 2024-07-26 NOTE — Discharge Instructions (Signed)

## 2024-07-26 NOTE — Anesthesia Preprocedure Evaluation (Signed)
 Anesthesia Evaluation  Patient identified by MRN, date of birth, ID band Patient awake    Reviewed: Allergy & Precautions, NPO status , Patient's Chart, lab work & pertinent test results  History of Anesthesia Complications Negative for: history of anesthetic complications  Airway Mallampati: I  TM Distance: >3 FB Neck ROM: Full    Dental  (+) Dental Advisory Given, Teeth Intact   Pulmonary neg shortness of breath, neg sleep apnea, neg COPD, neg recent URI, former smoker   breath sounds clear to auscultation       Cardiovascular hypertension, Pt. on medications (-) angina (-) CAD, (-) Past MI and (-) CHF (-) dysrhythmias  Rhythm:Regular     Neuro/Psych  Headaches, neg Seizures    GI/Hepatic negative GI ROS, Neg liver ROS,,,  Endo/Other    Renal/GU negative Renal ROSLab Results      Component                Value               Date                      NA                       139                 07/19/2024                K                        4.4                 07/19/2024                CO2                      24                  07/19/2024                GLUCOSE                  98                  07/19/2024                BUN                      18                  07/19/2024                CREATININE               0.78                07/19/2024                CALCIUM                   9.4                 07/19/2024                GFR                      80.33  04/04/2024                GFRNONAA                 >60                 07/19/2024                Musculoskeletal  (+) Arthritis ,    Abdominal   Peds  Hematology negative hematology ROS (+) Lab Results      Component                Value               Date                      WBC                      9.0                 07/19/2024                HGB                      14.1                07/19/2024                HCT                       42.8                07/19/2024                MCV                      89.7                07/19/2024                PLT                      240                 07/19/2024              Anesthesia Other Findings   Reproductive/Obstetrics                              Anesthesia Physical Anesthesia Plan  ASA: 2  Anesthesia Plan: MAC and Spinal   Post-op Pain Management: Ofirmev  IV (intra-op)*   Induction: Intravenous  PONV Risk Score and Plan: 1 and Ondansetron , Propofol  infusion and Treatment may vary due to age or medical condition  Airway Management Planned: Nasal Cannula, Natural Airway and Simple Face Mask  Additional Equipment: None  Intra-op Plan:   Post-operative Plan:   Informed Consent: I have reviewed the patients History and Physical, chart, labs and discussed the procedure including the risks, benefits and alternatives for the proposed anesthesia with the patient or authorized representative who has indicated his/her understanding and acceptance.     Dental advisory given  Plan Discussed with: CRNA  Anesthesia Plan Comments:         Anesthesia Quick Evaluation

## 2024-07-26 NOTE — Evaluation (Signed)
 Physical Therapy Brief Evaluation and Discharge Note Patient Details Name: Kerry Bright MRN: [id number] DOB: Apr 14, 1951 Today's Date: 07/26/2024   History of Present Illness  73 yo male presents to therapy s/p R THA anterior approach on 07/26/2024 due to failure of conservative measures. Pt PMH includes but is not limited to: HTN, HLD, cervical radiculopathy, arthritis, and L THA (2020).  Clinical Impression  Pt was seen in PACU. Upon initial assessment pt with L LE ( non operative LE) decreased sensation and 0/5 DF. Pt's operative R LE with present sensation and muscle activity. Reviewed exercises with pt and caregiver at this time , then waited for more sensation to return.   Second trial with patient ambulation 80 feet with S with RW with some LLE DF lag on swing phase, and slight decreased sensation in B glut area as well.   3rd trial with pt , pt had more muscle activity present with LLE DF present and improved sensation on B feet. PT stated he still was slightly numb in groin area and unable to urinate at this time. Completed stair training and caregiver education , as well as pt progression. Pt awaiting ability to urinate at this time. Passed PT for DC home purposes.       PT Assessment (P) Patient does not need any further PT services  Assistance Needed at Discharge  (P) Intermittent Supervision/Assistance    Equipment Recommendations (P) Rolling walker (2 wheels)  Recommendations for Other Services       Precautions/Restrictions Precautions Precautions: None Restrictions Weight Bearing Restrictions Per Provider Order: No        Mobility  Bed Mobility Rolling: Contact guard assist Supine/Sidelying to sit: Contact guard assist Sit to supine/sidelying: Contact guard assist    Transfers Overall transfer level: Needs assistance Equipment used: Rolling walker (2 wheels) Transfers: Sit to/from Stand Sit to Stand: Contact guard assist           General transfer  comment: cues for safety with RW    Ambulation/Gait Ambulation/Gait assistance: Contact guard assist Gait Distance (Feet): 80 Feet Assistive device: Rolling walker (2 wheels) Gait Pattern/deviations: Step-through pattern Gait Speed: Below normal General Gait Details: L foot still with decreased DF in swing phase, and R foot with IR and able to self correct but not to neurtral a this time. R Foot also not with foot flat or rolling through toes at this time. May improve as spinal block wears off.  Home Activity Instructions    Stairs Stairs: Yes Stairs assistance: Contact guard assist Stair Management: One rail Right Number of Stairs: 3 General stair comments: practiced 2 xs and wife present and demonstrated how she has to assist on his left side with grasp pf gait belt with her righ hand and hand to hand grip for her left hand to support his left side without a railing.  Modified Rankin (Stroke Patients Only)        Balance Overall balance assessment: Needs assistance Sitting-balance support: Bilateral upper extremity supported, Feet supported Sitting balance-Leahy Scale: Normal     Standing balance support: Bilateral upper extremity supported, During functional activity Standing balance-Leahy Scale: Good            Pertinent Vitals/Pain         Home Living Family/patient expects to be discharged to:: Private residence Living Arrangements: Spouse/significant other Available Help at Discharge: Family Home Environment: Stairs to enter  [place] of Steps: 3 Home Equipment: None        Prior Function  Level of Independence: Independent      UE/LE Assessment        LE ROM/Strength/Tone/Coordination: Impaired LE ROM/Strength/Tone/Coordination Deficits: L ( non operative LE ) has decreased DF , however waiting for anethesia to wear off for final assessment . Other movment and operative LE WNL for sensation and MMT    Communication    Communication Communication: No apparent difficulties     Cognition Overall Cognitive Status: Appears within functional limits for tasks assessed/performed       General Comments      Exercises Total Joint Exercises Ankle Circles/Pumps: AROM, Both, 10 reps, Supine Quad Sets: AROM, Supine, Both, 10 reps Gluteal Sets: AROM, Supine, Both, 10 reps Heel Slides: AROM, Right, 10 reps, Supine Hip ABduction/ADduction: AAROM, Supine, Right, 10 reps   Assessment/Plan    PT Problem List         PT Visit Diagnosis (P) Other abnormalities of gait and mobility (R26.89)    No Skilled PT (P) All education completed;Patient will have necessary level of assist by caregiver at discharge   Co-evaluation                AMPAC 6 Clicks Help needed turning from your back to your side while in a flat bed without using bedrails?: (P) None Help needed moving from lying on your back to sitting on the side of a flat bed without using bedrails?: (P) None Help needed moving to and from a bed to a chair (including a wheelchair)?: (P) None Help needed standing up from a chair using your arms (e.g., wheelchair or bedside chair)?: (P) A Little Help needed to walk in hospital room?: (P) A Little Help needed climbing 3-5 steps with a railing? : (P) A Little 6 Click Score: (P) 21      End of Session Equipment Utilized During Treatment: (P) Gait belt Activity Tolerance: (P) Patient tolerated treatment well Patient left: (P) in bed;with nursing/sitter in room;with call bell/phone within reach Nurse Communication: (P) Mobility status PT Visit Diagnosis: (P) Other abnormalities of gait and mobility (R26.89)     Time: 1600-1700 PT Time Calculation (min) (ACUTE ONLY): 60 min  Charges:   PT Evaluation $PT Eval Low Complexity: 1 Low PT Treatments $Gait Training: 23-37 mins    Kerry Bright, PT, MPT Acute Rehabilitation Services Office: [phone] If a weekend: secure chat groups: [facility] PT, [facility] OT, [facility]  SLP 07/26/2024   Kerry Bright  07/26/2024, 5:00 PM

## 2024-07-26 NOTE — Transfer of Care (Signed)
 Immediate Anesthesia Transfer of Care Note  Patient: Kerry Bright  Procedure(s) Performed: ARTHROPLASTY, HIP, TOTAL, ANTERIOR APPROACH (Right: Hip)  Patient Location: PACU  Anesthesia Type:Spinal  Level of Consciousness: sedated  Airway & Oxygen Therapy: Patient Spontanous Breathing and Patient connected to face mask oxygen  Post-op Assessment: Report given to RN and Post -op Vital signs reviewed and stable  Post vital signs: Reviewed and stable  Last Vitals:  Vitals Value Taken Time  BP 91/51 07/26/24 12:21  Temp 36 C 07/26/24 12:17  Pulse 51 07/26/24 12:23  Resp 11 07/26/24 12:23  SpO2 100 % 07/26/24 12:23  Vitals shown include unfiled device data.  Last Pain:  Vitals:   07/26/24 0846  TempSrc: Oral  PainSc:          Complications: No notable events documented.

## 2024-07-27 ENCOUNTER — Encounter (HOSPITAL_COMMUNITY): Payer: Self-pay | Admitting: Orthopedic Surgery

## 2024-09-05 ENCOUNTER — Other Ambulatory Visit: Payer: Self-pay | Admitting: Family Medicine

## 2024-09-05 DIAGNOSIS — I10 Essential (primary) hypertension: Secondary | ICD-10-CM

## 2025-03-31 ENCOUNTER — Ambulatory Visit
